# Patient Record
Sex: Male | Born: 1960 | Race: White | Hispanic: No | Marital: Married | Smoking: Former smoker
Health system: Southern US, Community
[De-identification: ages and names within clinical notes are randomized; demographics above are authoritative.]

## PROBLEM LIST (undated history)

## (undated) DIAGNOSIS — I10 Essential (primary) hypertension: Secondary | ICD-10-CM

---

## 2006-07-08 ENCOUNTER — Inpatient Hospital Stay: Payer: Self-pay | Admitting: Unknown Physician Specialty

## 2007-05-09 ENCOUNTER — Other Ambulatory Visit: Payer: Self-pay

## 2007-05-09 ENCOUNTER — Emergency Department: Payer: Self-pay | Admitting: Emergency Medicine

## 2007-09-03 ENCOUNTER — Emergency Department: Payer: Self-pay | Admitting: Emergency Medicine

## 2008-04-23 ENCOUNTER — Emergency Department: Payer: Self-pay | Admitting: Emergency Medicine

## 2008-04-24 ENCOUNTER — Other Ambulatory Visit: Payer: Self-pay

## 2010-06-05 ENCOUNTER — Emergency Department: Payer: Self-pay | Admitting: Emergency Medicine

## 2011-06-27 ENCOUNTER — Emergency Department: Payer: Self-pay | Admitting: Emergency Medicine

## 2011-06-27 IMAGING — CT CT HEAD WITHOUT CONTRAST
2 series · 16 of 30 positions shown, 20 images · non-contrast
Comparison: none

REASON FOR EXAM: tia symptoms
COMMENTS:

[Series 2: without · axial · non-contrast · 0.44mm/px · z∈[-156,-26]mm · 13 of 32 slices shown, 17 images]
[im 3/32  brain]
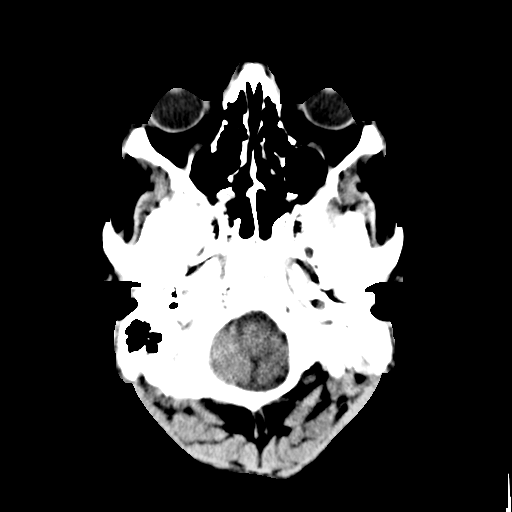
[im 3/32  bone]
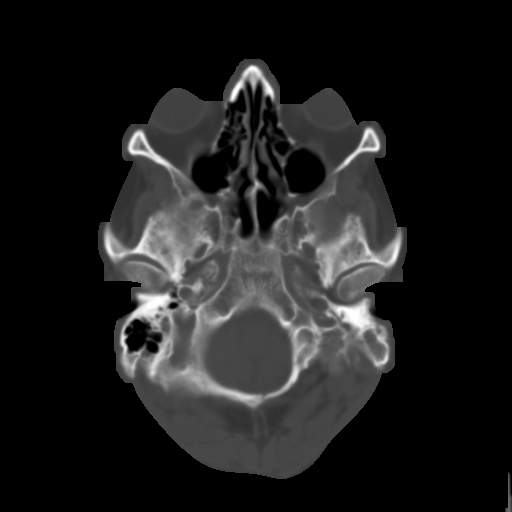
[im 5/32  brain]
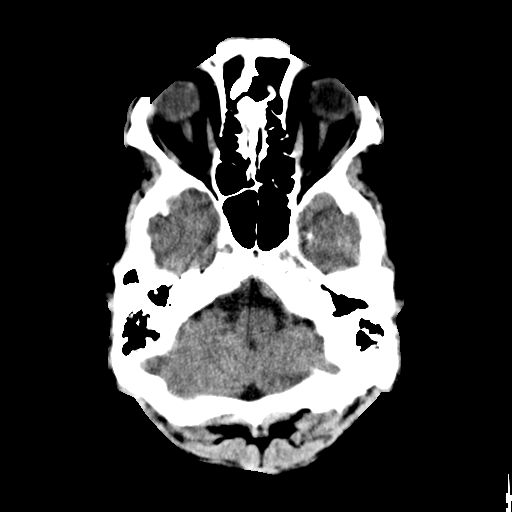
[im 7/32  brain]
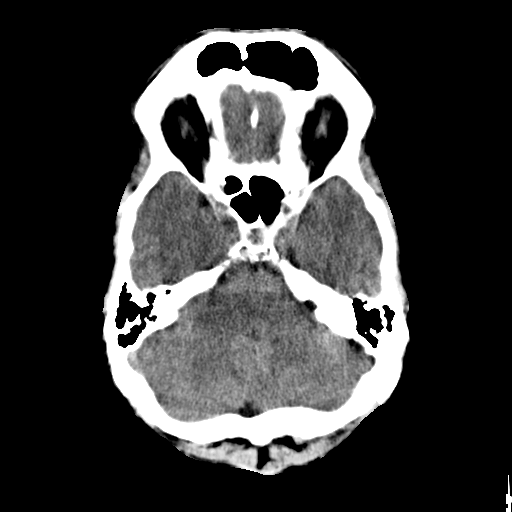
[im 9/32  brain]
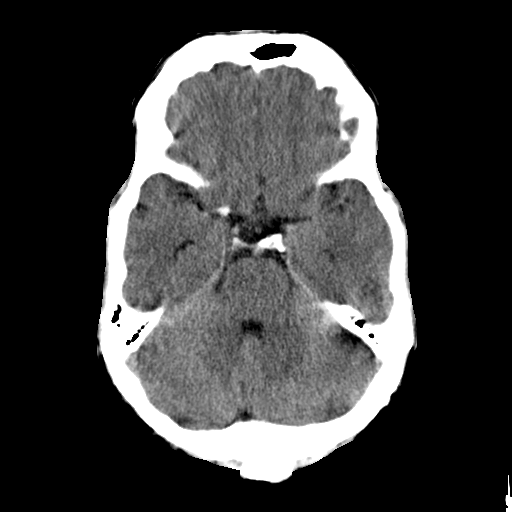
[im 12/32  brain]
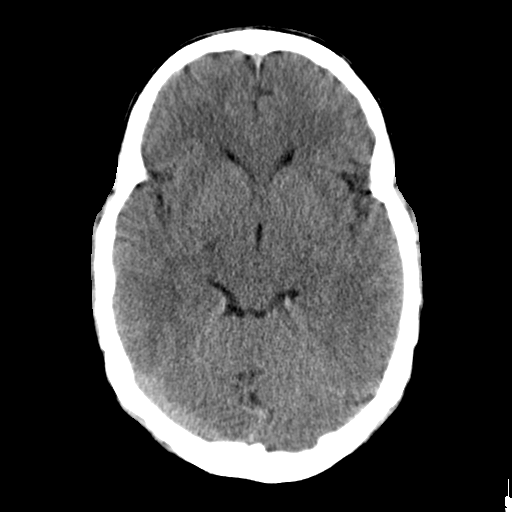
[im 12/32  bone]
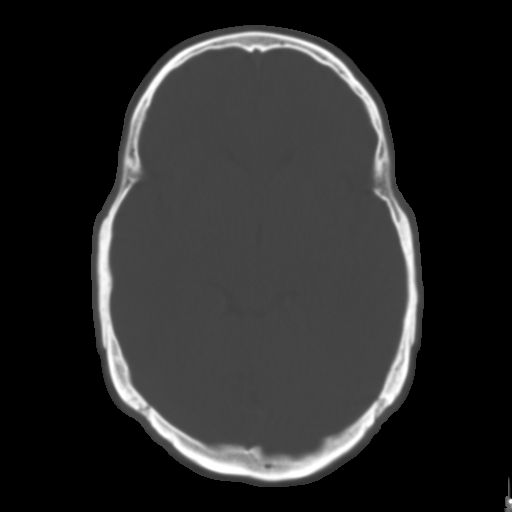
[im 14/32  brain]
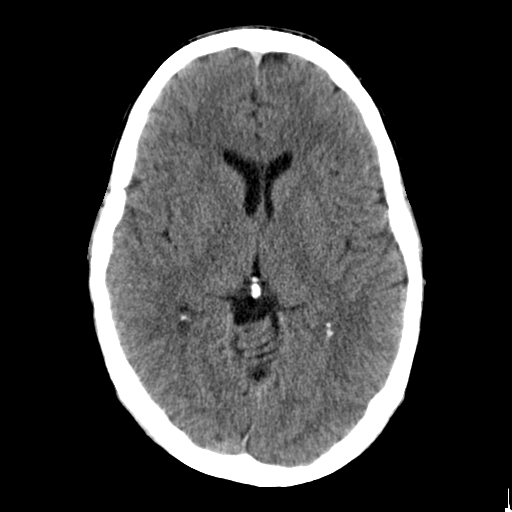
[im 16/32  brain]
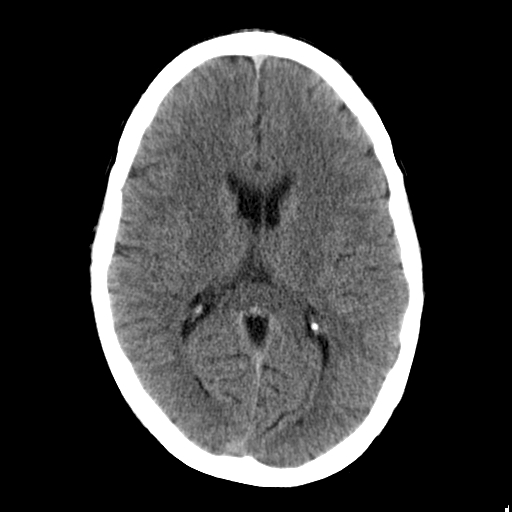
[im 18/32  brain]
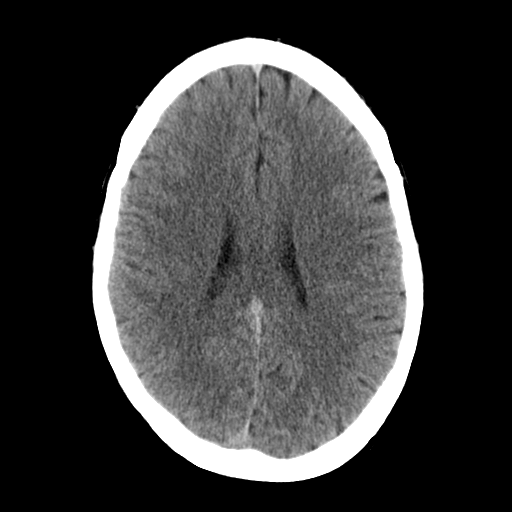
[im 20/32  brain]
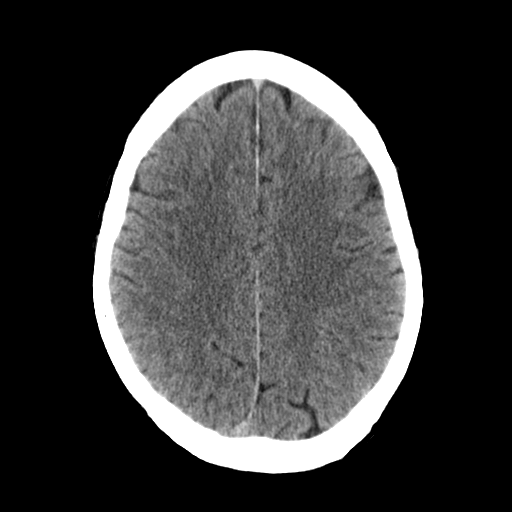
[im 20/32  bone]
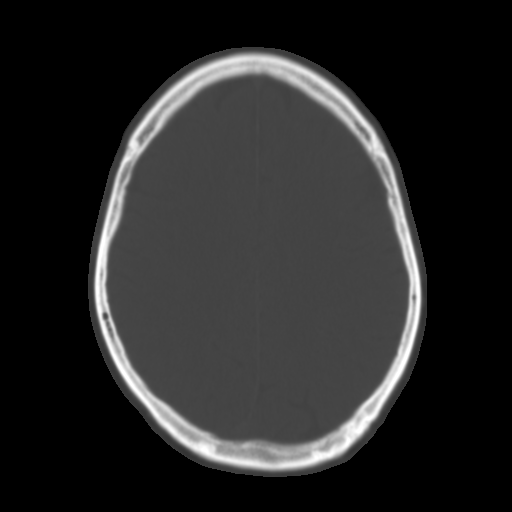
[im 23/32  brain]
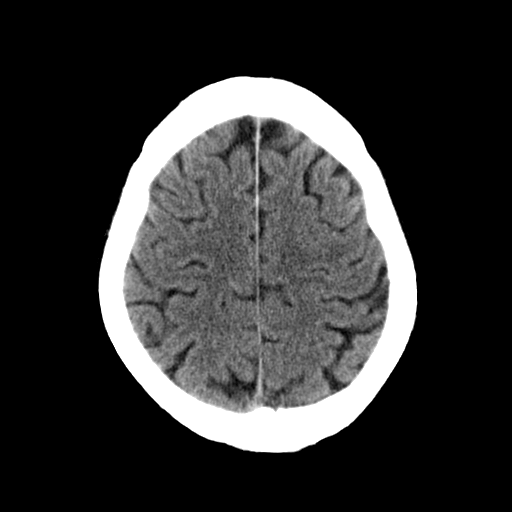
[im 25/32  brain]
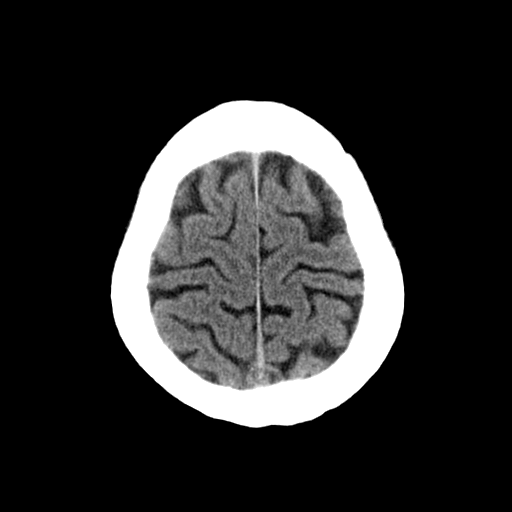
[im 27/32  brain]
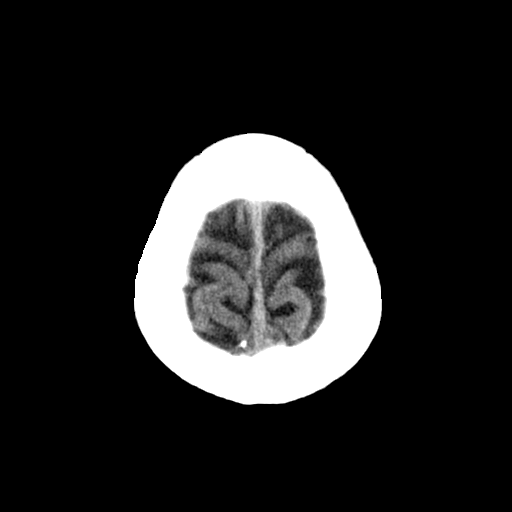
[im 29/32  brain]
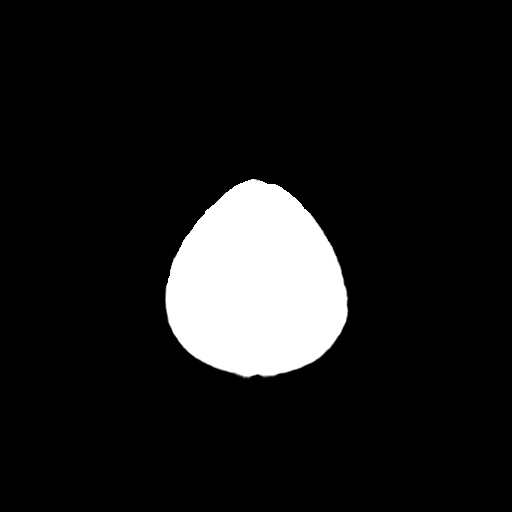
[im 29/32  bone]
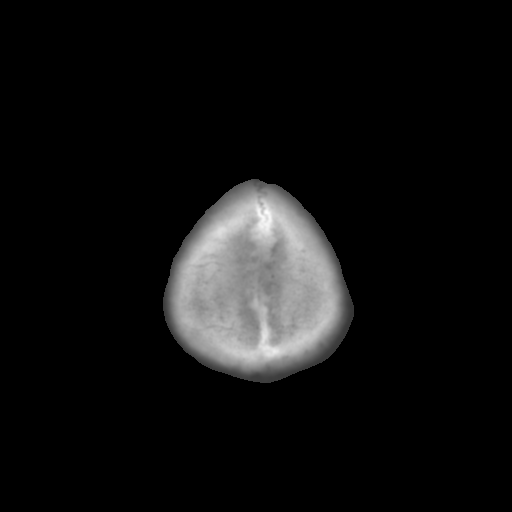

[Series 3: bone · axial · 0.44mm/px · z∈[-156,-111]mm · 3 of 32 slices shown]
[im 3/32  bone]
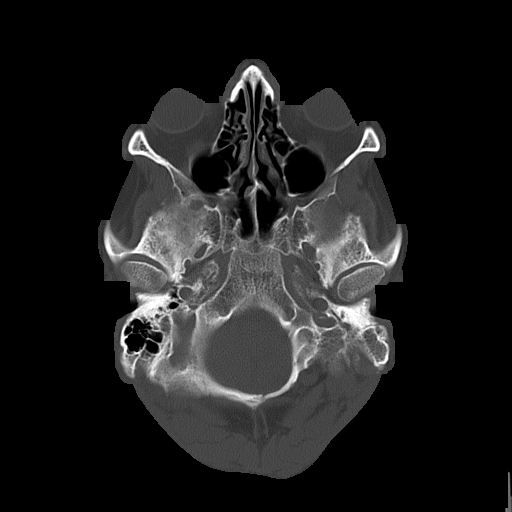
[im 7/32  bone]
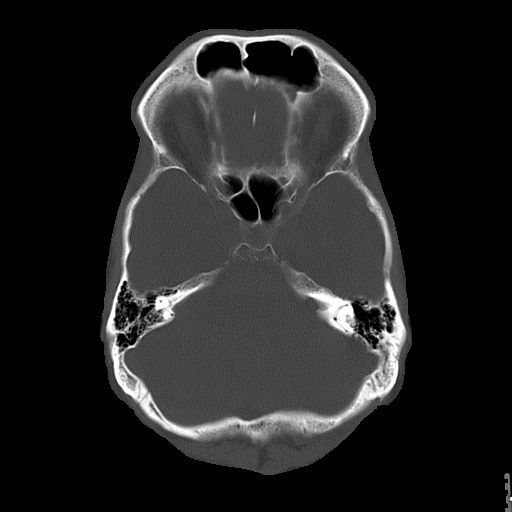
[im 12/32  bone]
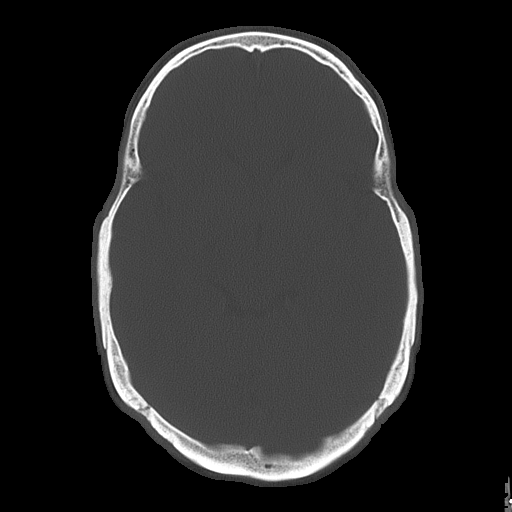

[16 of 30 positions shown; findings below may reference images not displayed]

PROCEDURE:     CT  - CT HEAD WITHOUT CONTRAST  - [DATE] [DATE]

RESULT:     Axial noncontrast CT scanning was performed through the brain at
5 mm intervals and slice thicknesses.

The ventricles are normal in size and position. There is no intracranial
hemorrhage nor intracranial mass effect. The cerebellum and brainstem are
normal in density. There is no evidence of an evolving ischemic infarction.
No abnormal intracranial calcifications are demonstrated.

The observed portions of the paranasal sinuses and mastoid air cells are
clear. The external auditory canals appear patent where visualized. The
structures of the middle ear cavities are grossly normal.
IMPRESSION: 1. I see no acute abnormality of the brain.
2. The visualized portions of the paranasal sinuses and mastoid air cells
are clear.

## 2011-11-14 ENCOUNTER — Emergency Department: Payer: Self-pay | Admitting: Emergency Medicine

## 2011-11-14 LAB — COMPREHENSIVE METABOLIC PANEL
Albumin: 4.5 g/dL (ref 3.4–5.0)
Alkaline Phosphatase: 79 U/L (ref 50–136)
BUN: 7 mg/dL (ref 7–18)
Chloride: 104 mmol/L (ref 98–107)
Creatinine: 0.89 mg/dL (ref 0.60–1.30)
EGFR (African American): >60
EGFR (Non-African Amer.): >60
Glucose: 89 mg/dL (ref 65–99)
Osmolality: 277 (ref 275–301)
Potassium: 4.4 mmol/L (ref 3.5–5.1)
SGOT(AST): 19 U/L (ref 15–37)
SGPT (ALT): 21 U/L
Sodium: 140 mmol/L (ref 136–145)
Total Protein: 8.1 g/dL (ref 6.4–8.2)

## 2011-11-14 LAB — CBC
MCHC: 34.3 g/dL (ref 32.0–36.0)
MCV: 97 fL (ref 80–100)
Platelet: 271 10*3/uL (ref 150–440)
RDW: 13.4 % (ref 11.5–14.5)

## 2013-10-19 ENCOUNTER — Ambulatory Visit: Payer: Self-pay | Admitting: Orthopedic Surgery

## 2013-10-19 ENCOUNTER — Emergency Department: Payer: Self-pay | Admitting: Emergency Medicine

## 2013-10-19 LAB — CBC
HCT: 38.9 % — ABNORMAL LOW (ref 40.0–52.0)
HGB: 13.3 g/dL (ref 13.0–18.0)
MCH: 32.6 pg (ref 26.0–34.0)
MCHC: 34.3 g/dL (ref 32.0–36.0)
MCV: 95 fL (ref 80–100)
Platelet: 266 10*3/uL (ref 150–440)
RBC: 4.09 10*6/uL — AB (ref 4.40–5.90)
RDW: 13.5 % (ref 11.5–14.5)
WBC: 6.5 10*3/uL (ref 3.8–10.6)

## 2013-10-19 IMAGING — CR DG HAND COMPLETE 3+V*L*
1 series · 3 of 3 positions shown · non-contrast
Comparison: None available for comparison at time of study
interpretation.

CLINICAL DATA: Bilateral hand lacerations.

EXAM:
LEFT HAND - COMPLETE 3+ VIEW; RIGHT HAND - COMPLETE 3+ VIEW

[Series 1: pa · 0.17mm/px · 3 of 3 slices shown]
[im 1/3]
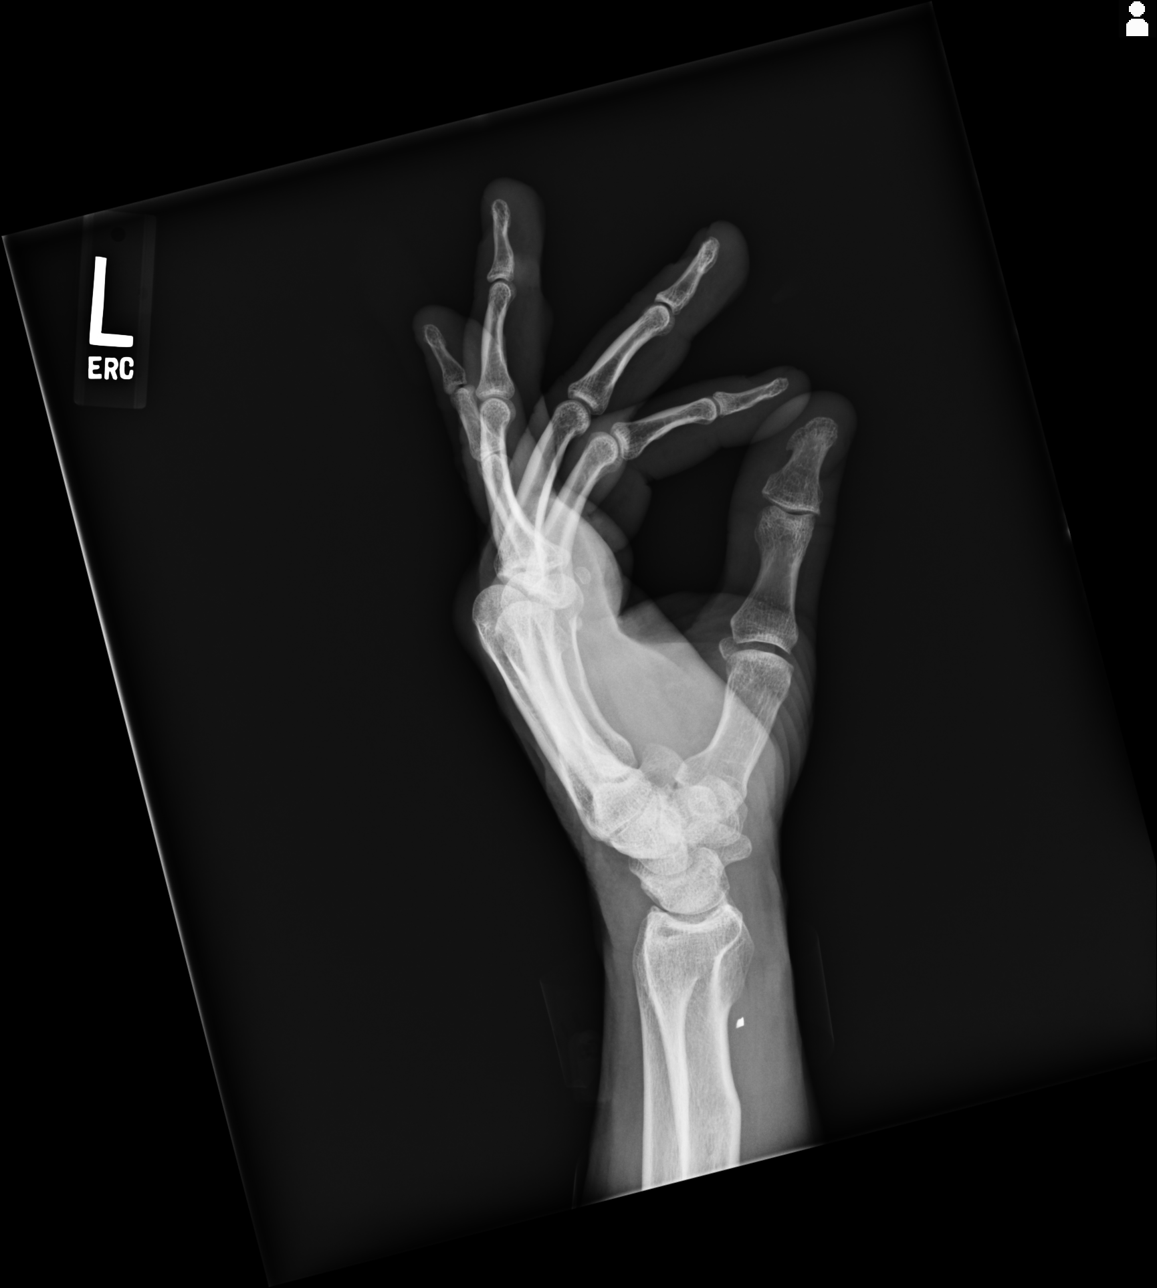
[im 2/3]
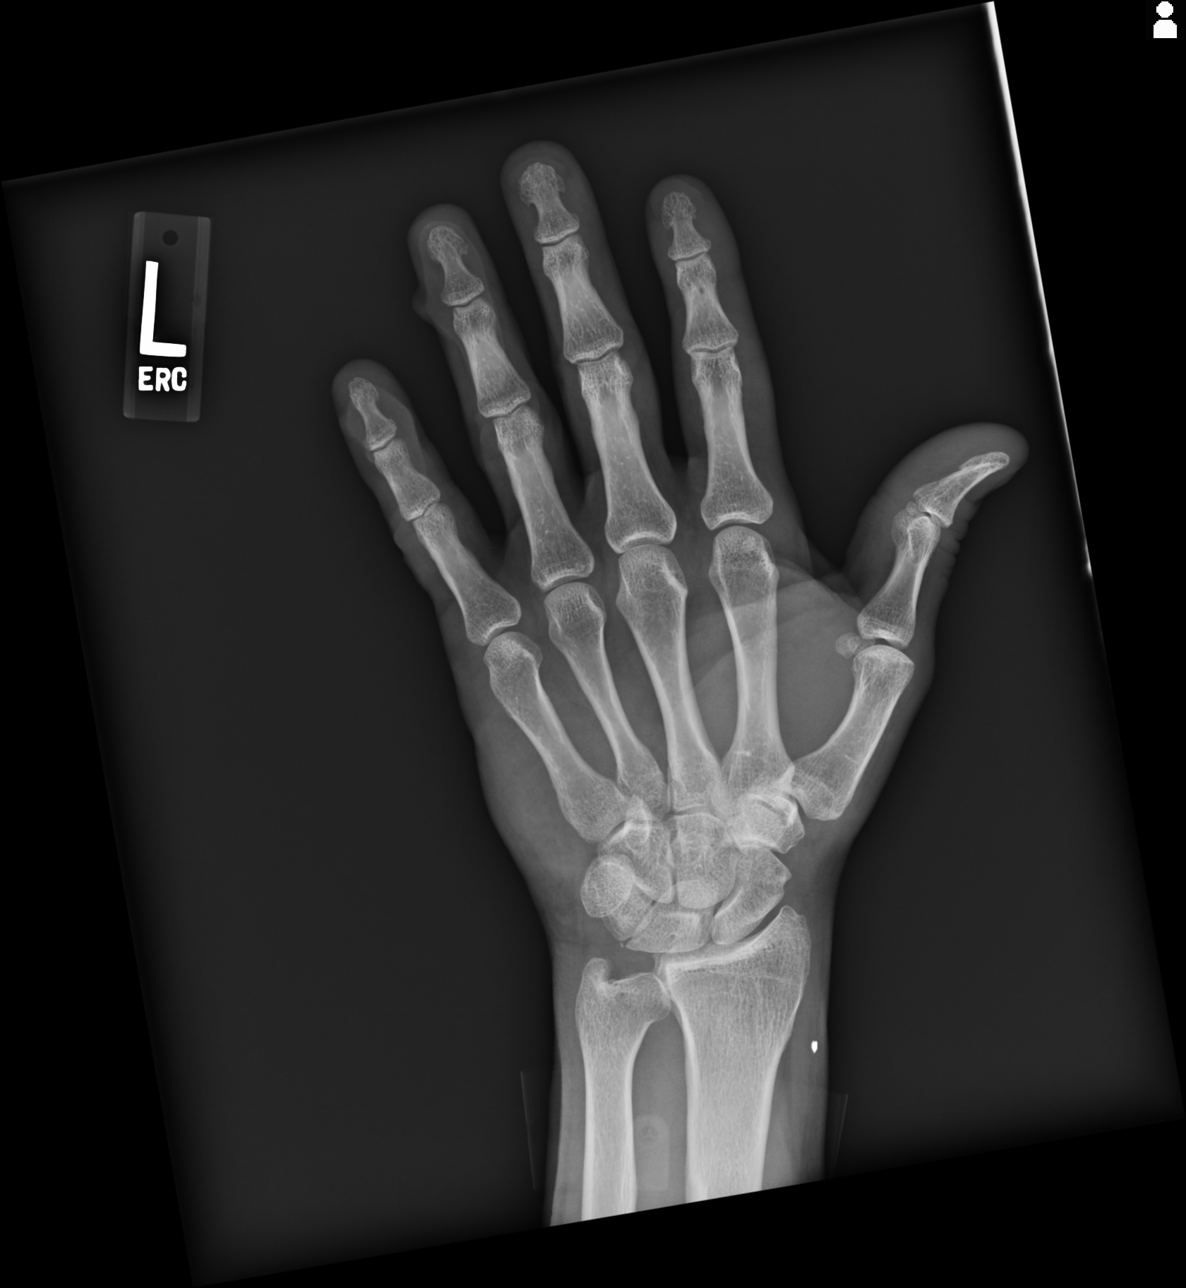
[im 3/3]
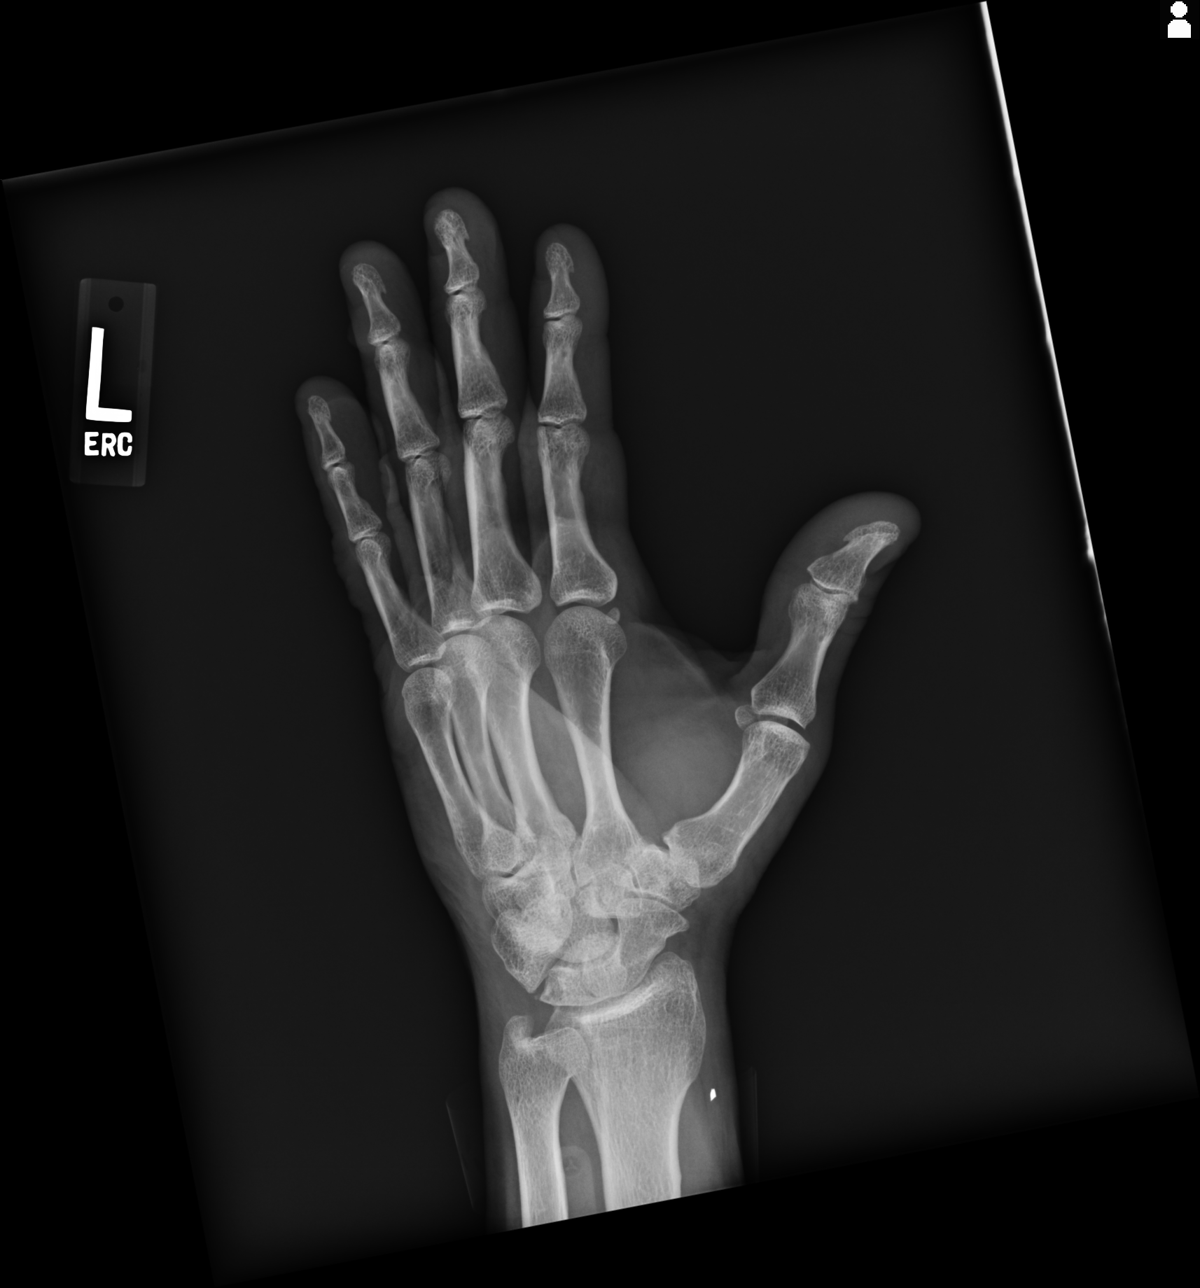

[3 of 3 positions shown; findings below may reference images not displayed]

FINDINGS: There is no evidence of fracture or dislocation. There is no
evidence of arthropathy or other focal bone abnormality. 3 mm
radiopaque foreign body projects in the radial soft tissues of the
distal wrist on the left. Soft tissue irregularity of the left
fourth digit may reflect laceration.
IMPRESSION: No acute fracture or frank dislocation.

3 mm radiopaque foreign body projecting in the left wrist radial
soft tissues.

  By: KRISTIJONA

## 2013-10-19 IMAGING — CR RIGHT HAND - COMPLETE 3+ VIEW
1 series · 3 of 3 positions shown · non-contrast
Comparison: None available for comparison at time of study
interpretation.

CLINICAL DATA: Bilateral hand lacerations.

EXAM:
LEFT HAND - COMPLETE 3+ VIEW; RIGHT HAND - COMPLETE 3+ VIEW

[Series 1: pa · 0.17mm/px · 3 of 3 slices shown]
[im 1/3]
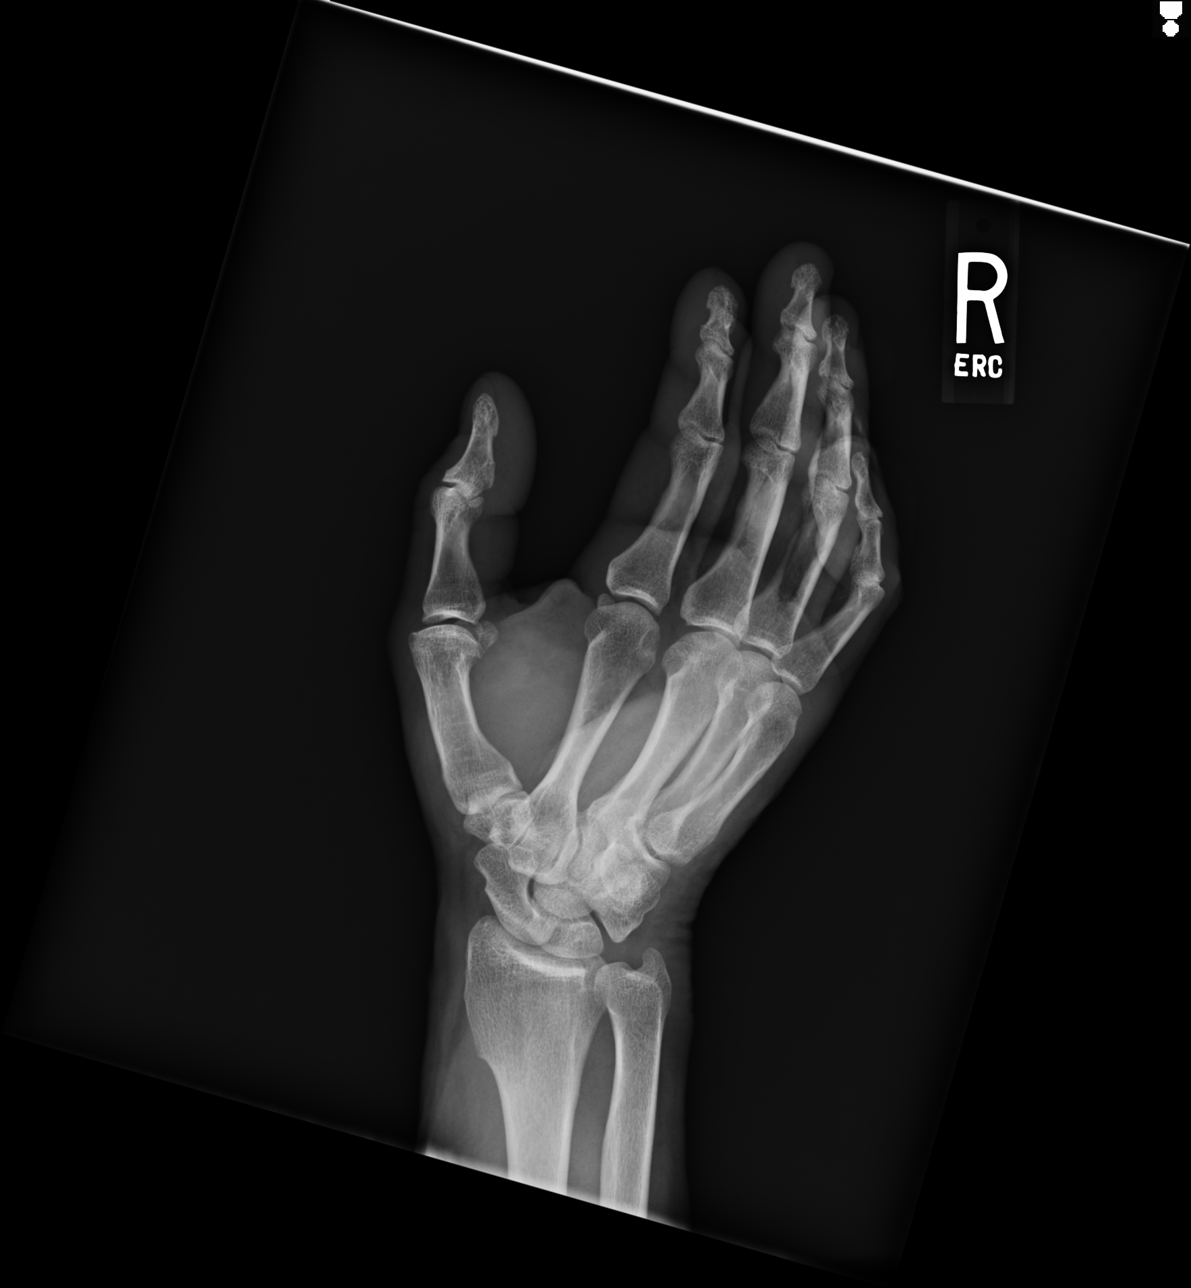
[im 2/3]
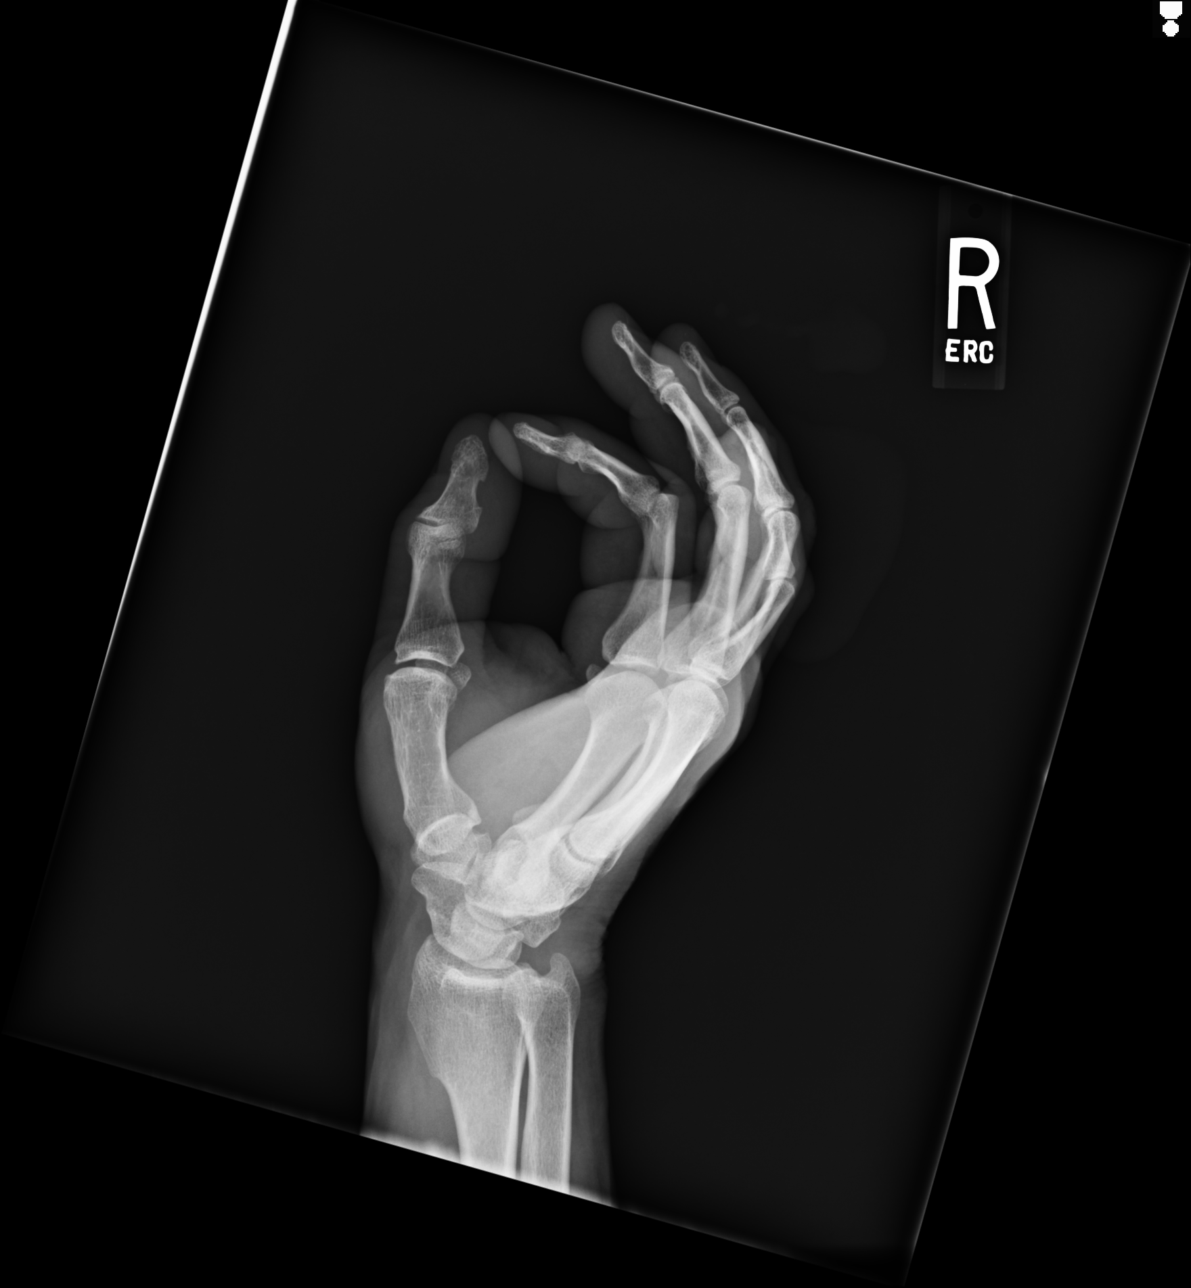
[im 3/3]
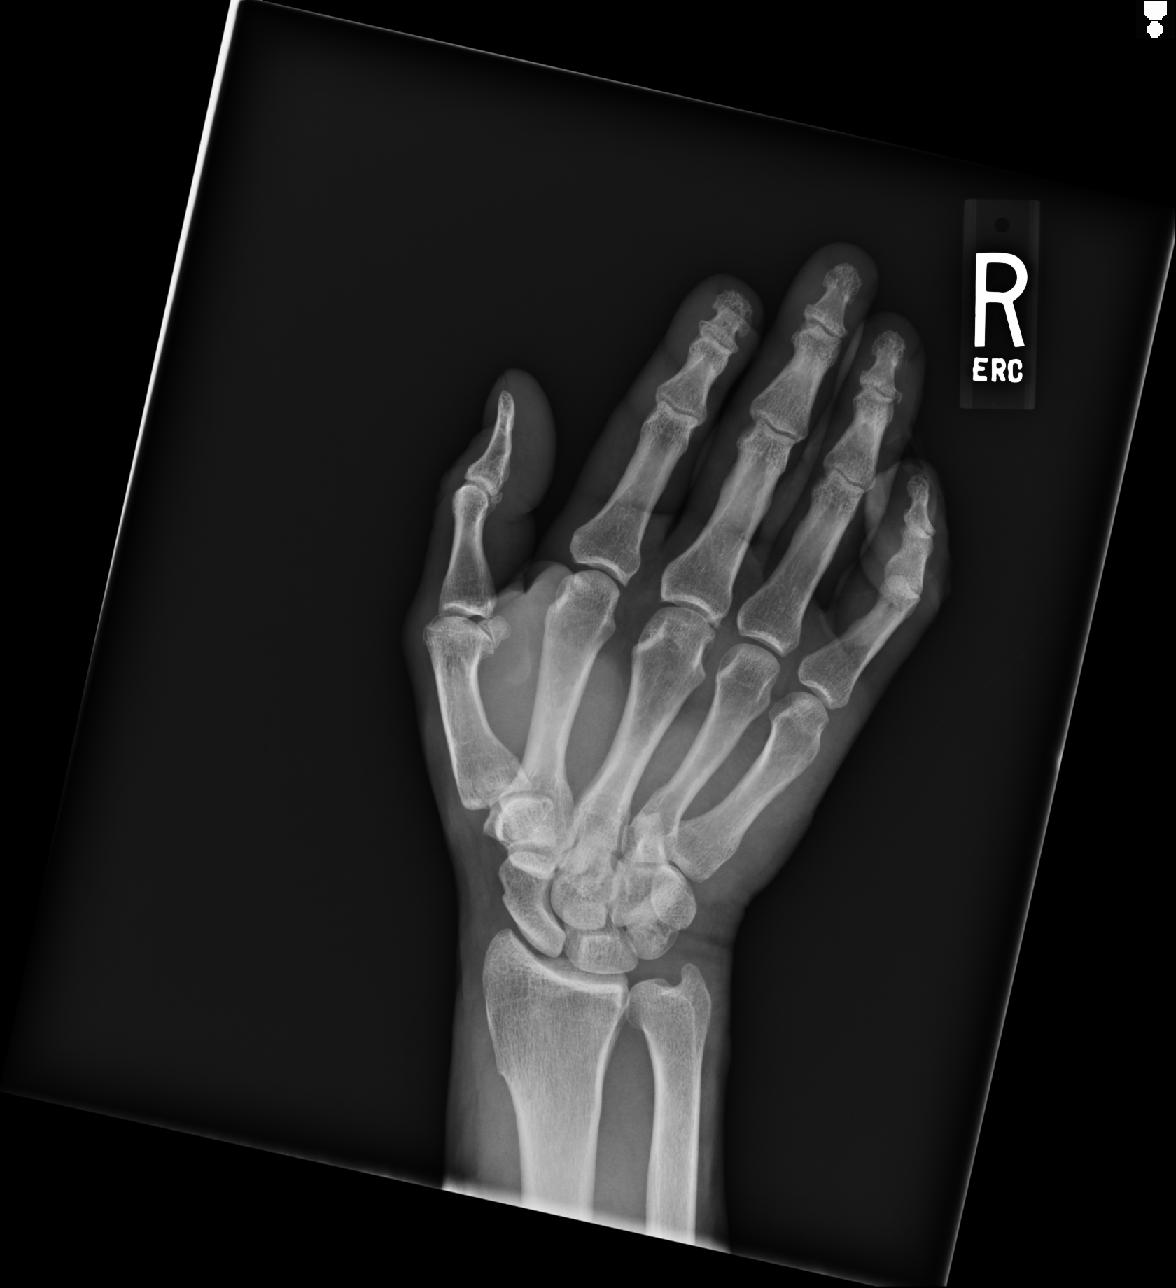

[3 of 3 positions shown; findings below may reference images not displayed]

FINDINGS: There is no evidence of fracture or dislocation. There is no
evidence of arthropathy or other focal bone abnormality. 3 mm
radiopaque foreign body projects in the radial soft tissues of the
distal wrist on the left. Soft tissue irregularity of the left
fourth digit may reflect laceration.
IMPRESSION: No acute fracture or frank dislocation.

3 mm radiopaque foreign body projecting in the left wrist radial
soft tissues.

  By: KRISTIJONA

## 2015-01-03 ENCOUNTER — Emergency Department: Admit: 2015-01-03 | Disposition: A | Discharge status: Home / Self Care | Payer: Self-pay | Admitting: Student

## 2015-01-15 NOTE — Consult Note (Signed)
PATIENT NAME:  Angel Padilla, [name] MR#:  [id number] DATE OF BIRTH:  09/14/1961  DATE OF CONSULTATION:  10/19/2013  REFERRING PHYSICIAN:  Emergency department  CONSULTING PHYSICIAN:  Ruthe MannanPeter Marjorie Deprey, MD  CHIEF COMPLAINT: Bilateral hand lacerations.  HISTORY: The patient is a 54 year old male who was at work and he got his hands caught in a slicing machine. He suffered injuries to his bilateral upper extremities. The patient had initially undergone partial treatment by the emergency department when they encountered an uncontrolled bleeding. I was called emergently to the ER to assist with management.   Upon my arrival, the patient had already been given bilateral upper extremity digital blocks in the small and ring finger. I was unable to obtain a sensory exam. There was profuse bleeding coming from the right small finger.   PAST MEDICAL HISTORY: None.  MEDICATIONS: See electronic record.  ALLERGIES: None.  SOCIAL HISTORY: The patient lives locally.  REVIEW OF SYSTEMS: Review of systems otherwise negative.   PHYSICAL EXAMINATION:  GENERAL: The patient is a well-nourished, well-developed male. He is moderate situational distress.   UPPER EXTREMITIES: The previous tourniquet was removed as it was a venous touniquet and not providing hemostasis. The right upper extremity was wrapped out with an Eschmark, and the touniquet was inflated. This provided a bloodless field. Wounds were irrigated and explored. I performed this for both the right and left upper extremities  Exploration of the fingers and my treatment was as follows:  RIGHT SMALL FINGER: Coronal plane laceration, 8 cm. Laceration of ulnar digital nerve and artery and 90% of FDP in Zone 1. Ligated the ulnar digital artery and closed the skin with 4-0 Nylon. Tip was warm and well perfused following this.  RIGHT RING FINGER: Longitudional laceration on ulnar side, 6 cm. Closed the skin with 4-0 Nylon. Tip was warm and well perfused following  this.  LEFT SMALL FINGER: Coronal laceration, 8 cm. In pulp only, superficial to the NV bundle. Closed the skin with 4-0 Nylon. Tip was warm and well perfused following this.  LEFT RING FINGER: Coronal laceration, 6 cm. In dermis only, with a thin flap. Defatted the flap, pie-crusted the full-thickness dermis flap with an #11 blade and inset the dermis like a full-thuckness skin graft. Closed the skin with 4-0 Nylon. Tip was warm and well perfused following this.  All wound were then sterilly dressed.  ASSESSMENT AND PLAN: 54 year old male with a work related multiple finger injury. Given the degree of injury to the FDP tendon and digital nerves on the right small finger, my recommendation is for surgical exploration and repair of the FDP tendon and repair of the digital nerve, if technically feasible. This ought to be done by a Merchant navy officerqualified Hand Surgeon. My recommendation is to followup in the next 1 to 3 days with a Hand Surgeon for a repeat clinical examination and possible surgical exploration with repair of the right small finger ulnar digital nerve and FDP, if the Hand Surgeon feels that this is appropriate.   This plan was explained to the paitent, his boss and the plan RN. They verbalized their understanding.   ____________________________ Ruthe MannanPeter Kenshin Splawn, MD pa:sg D: 10/19/2013 10:26:00 ET T: 10/19/2013 10:33:03 ET JOB#: [id number]  cc: Ruthe MannanPeter Shon Mansouri, MD, <Dictator> Althea GrimmerPeter J. Mickle PlumbApel, MD PhD Orthopaedic Surgery ELECTRONICALLY SIGNED 10/19/2013 14:29

## 2015-01-24 ENCOUNTER — Emergency Department: Payer: BLUE CROSS/BLUE SHIELD

## 2015-01-24 ENCOUNTER — Encounter: Payer: Self-pay | Admitting: Emergency Medicine

## 2015-01-24 ENCOUNTER — Emergency Department
Admission: EM | Admit: 2015-01-24 | Discharge: 2015-01-24 | Disposition: A | Discharge status: Home / Self Care | Payer: BLUE CROSS/BLUE SHIELD | Attending: Emergency Medicine | Admitting: Emergency Medicine

## 2015-01-24 DIAGNOSIS — H9203 Otalgia, bilateral: Secondary | ICD-10-CM | POA: Insufficient documentation

## 2015-01-24 DIAGNOSIS — Z79899 Other long term (current) drug therapy: Secondary | ICD-10-CM | POA: Diagnosis not present

## 2015-01-24 DIAGNOSIS — R51 Headache: Secondary | ICD-10-CM | POA: Diagnosis not present

## 2015-01-24 DIAGNOSIS — Z720 Tobacco use: Secondary | ICD-10-CM | POA: Diagnosis not present

## 2015-01-24 DIAGNOSIS — I10 Essential (primary) hypertension: Secondary | ICD-10-CM | POA: Insufficient documentation

## 2015-01-24 DIAGNOSIS — R519 Headache, unspecified: Secondary | ICD-10-CM

## 2015-01-24 HISTORY — DX: Essential (primary) hypertension: I10

## 2015-01-24 IMAGING — CT CT HEAD W/O CM
1 series · 16 of 30 positions shown, 20 images · non-contrast
Comparison: [DATE]

CLINICAL DATA: Complains of sinus pressure, dizziness, ear pain 2-3
months.

EXAM:
CT HEAD WITHOUT CONTRAST
TECHNIQUE: Contiguous axial images were obtained from the base of the skull
through the vertex without intravenous contrast.

[Series 2: head wo · axial · 0.44mm/px · z∈[+704,+848]mm · 16 of 36 slices shown, 20 images]
[im 2/36  brain]
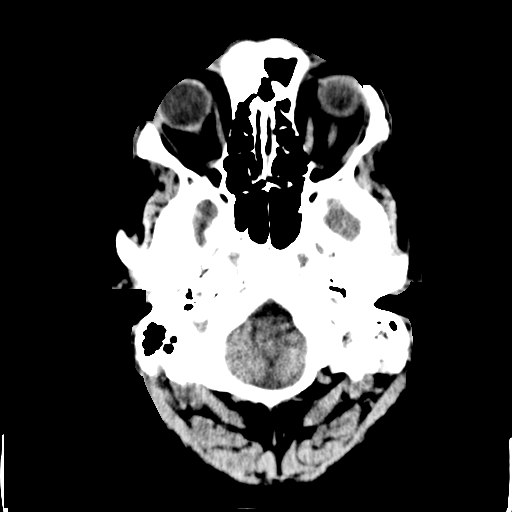
[im 2/36  bone]
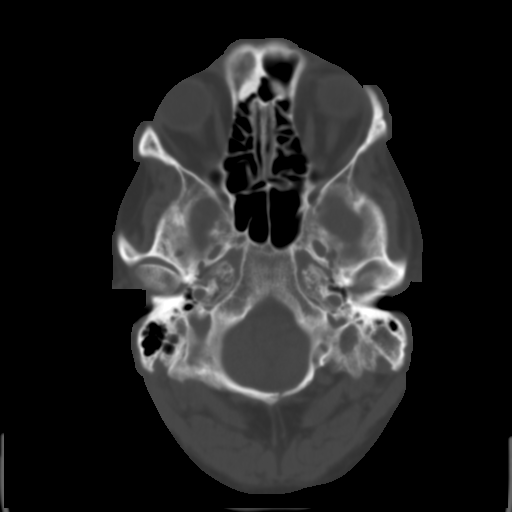
[im 4/36  brain]
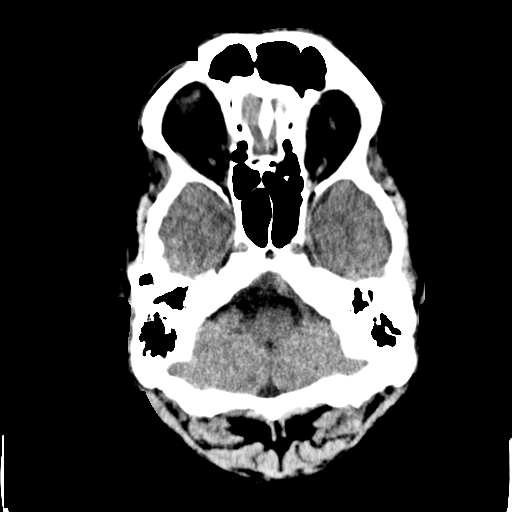
[im 7/36  brain]
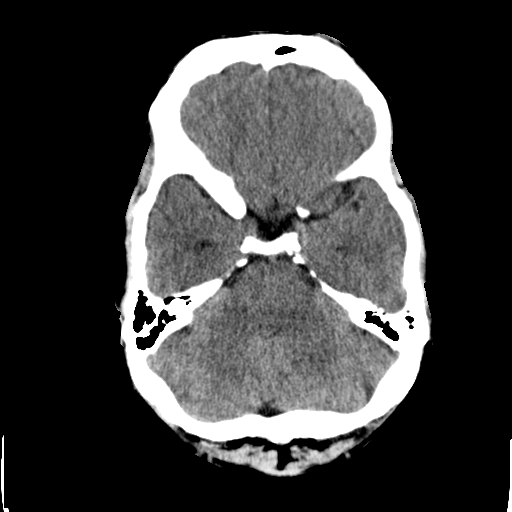
[im 9/36  brain]
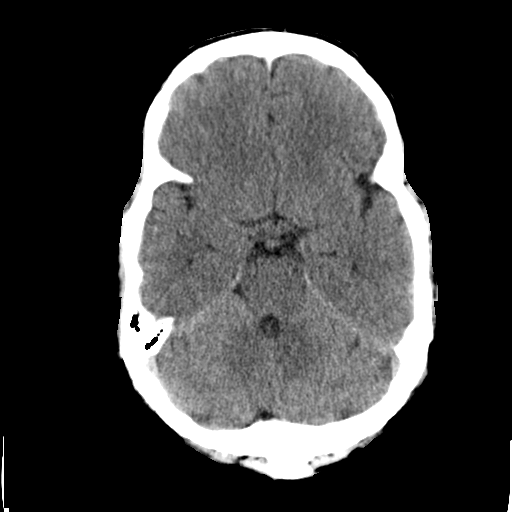
[im 10/36  brain]
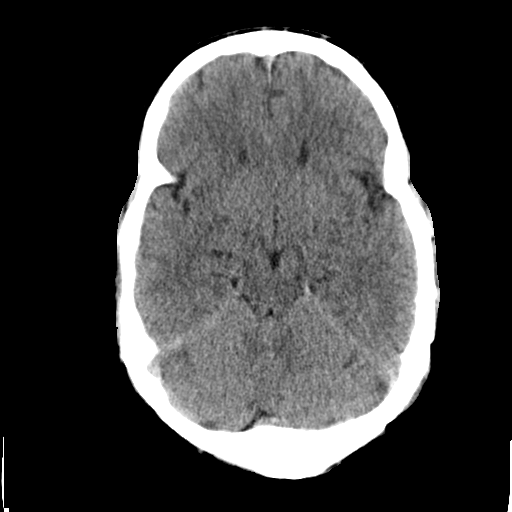
[im 10/36  bone]
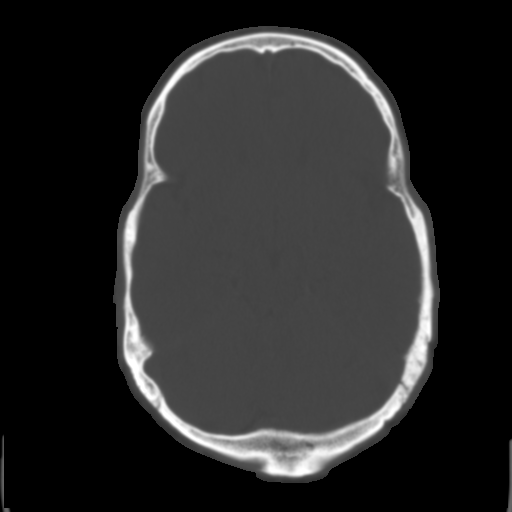
[im 13/36  brain]
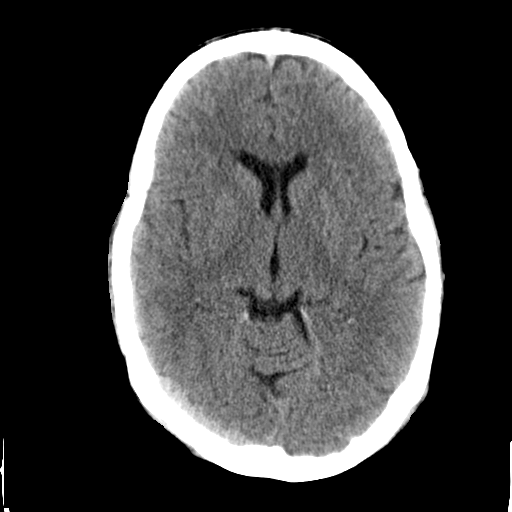
[im 15/36  brain]
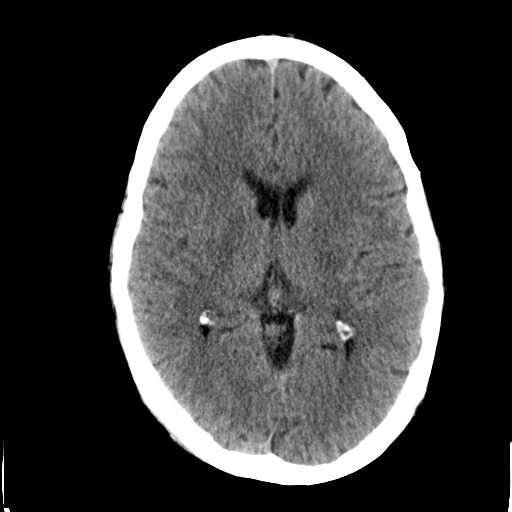
[im 17/36  brain]
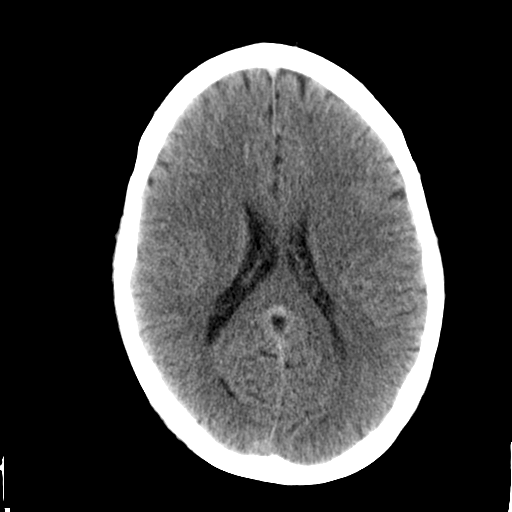
[im 19/36  brain]
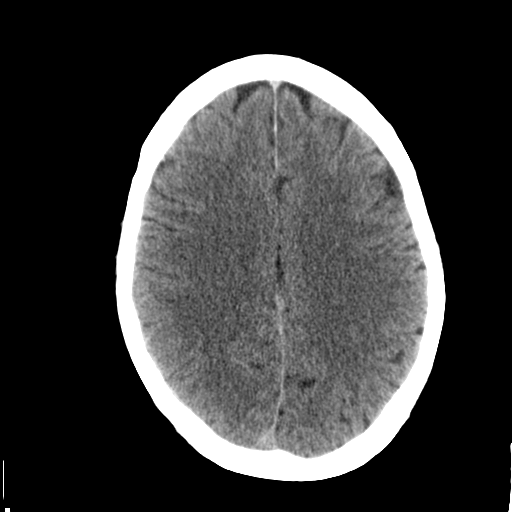
[im 19/36  bone]
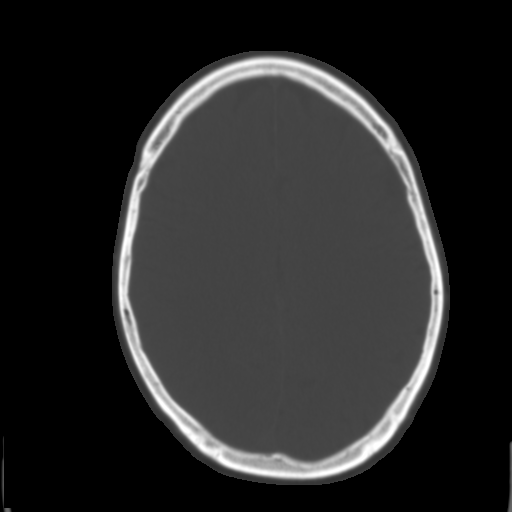
[im 21/36  brain]
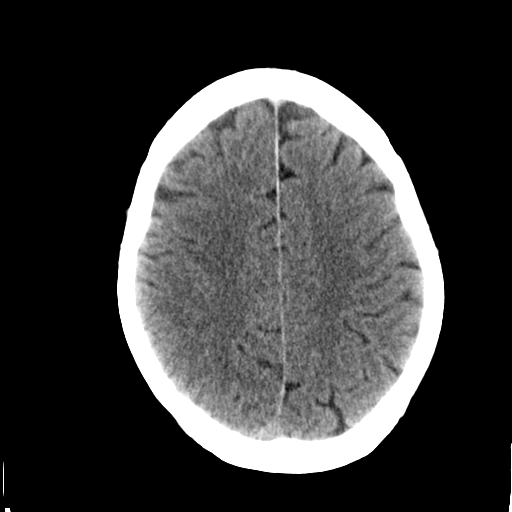
[im 23/36  brain]
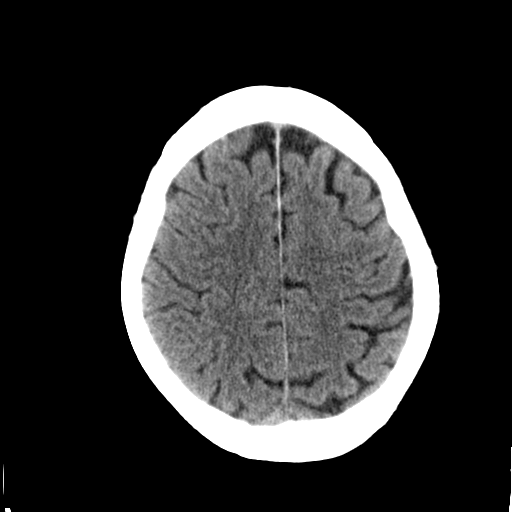
[im 26/36  brain]
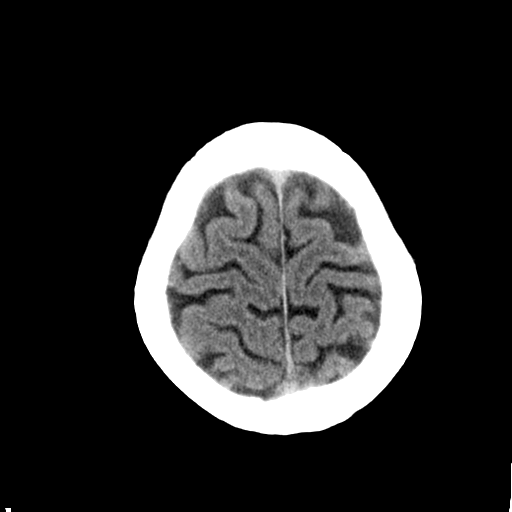
[im 27/36  brain]
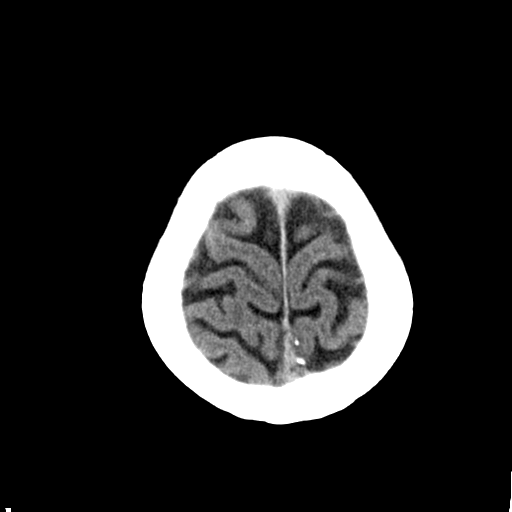
[im 27/36  bone]
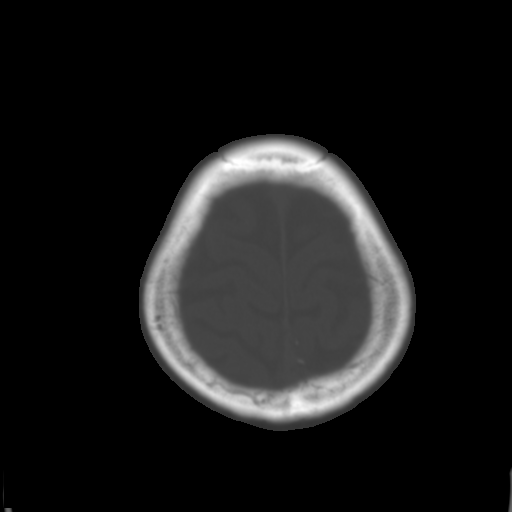
[im 29/36  brain]
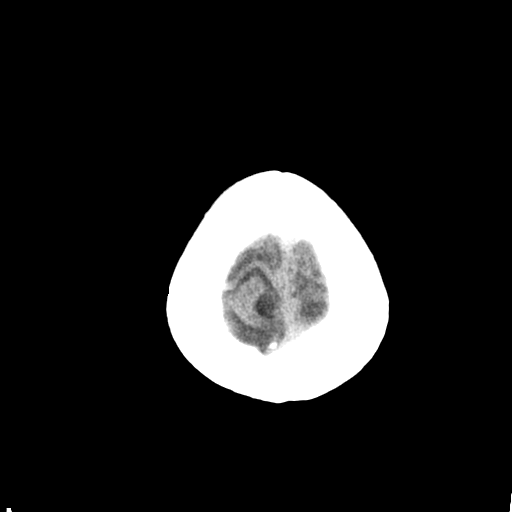
[im 32/36  brain]
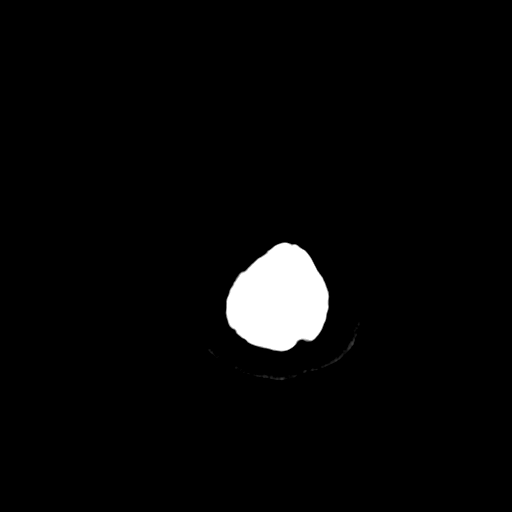
[im 34/36  brain]
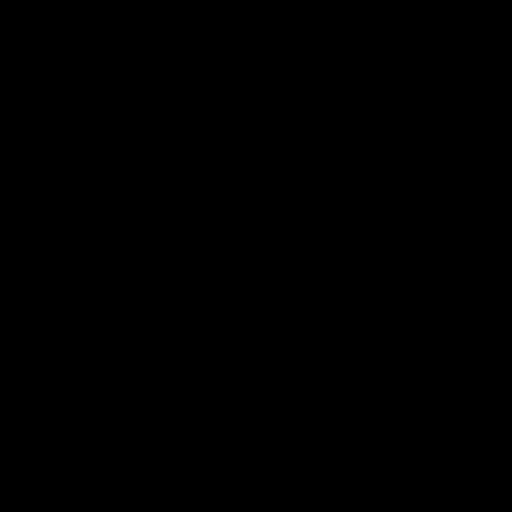

[16 of 30 positions shown; findings below may reference images not displayed]

FINDINGS: There is no evidence of mass effect, midline shift or extra-axial
fluid collections. There is no evidence of a space-occupying lesion
or intracranial hemorrhage. There is no evidence of a cortical-based
area of acute infarction.

The ventricles and sulci are appropriate for the patient's age. The
basal cisterns are patent.

Visualized portions of the orbits are unremarkable. The visualized
portions of the paranasal sinuses and mastoid air cells are
unremarkable.

The osseous structures are unremarkable.
IMPRESSION: Normal CT of the brain without intravenous contrast.

## 2015-01-24 MED ORDER — CLONIDINE HCL 0.1 MG PO TABS: 0.1000 mg | ORAL_TABLET | Freq: Two times a day (BID) | ORAL | Status: DC

## 2015-01-24 MED ORDER — CLONIDINE HCL 0.1 MG PO TABS
ORAL_TABLET | ORAL | Status: AC
  Filled 2015-01-24: qty 1

## 2015-01-24 MED ORDER — BUTALBITAL-APAP-CAFFEINE 50-325-40 MG PO TABS
1.0000 | ORAL_TABLET | Freq: Four times a day (QID) | ORAL | Status: AC | PRN
Start: 2015-01-24 — End: 2016-01-24

## 2015-01-24 MED ORDER — CLONIDINE HCL 0.1 MG PO TABS
0.1000 mg | ORAL_TABLET | Freq: Once | ORAL | Status: AC
  Administered 2015-01-24: 0.1 mg via ORAL

## 2015-01-24 NOTE — ED Provider Notes (Signed)
[facility] Emergency Department Provider Note    ____________________________________________  Time seen: 09 36  I have reviewed the triage vital signs and the nursing notes.   HISTORY  Chief Complaint Otalgia  HPI Angel Padilla is a 54 y.o. male who presents with bilateral ear pain and pressure in his head. He also states that he has been having trouble concentrating at work. He has been evaluated by [facility]. Per patient the ENT specialist told him he would need to have a scan if his symptoms persisted. He reports compliance with antibiotic prescriptions, allergy medication, and home medicines. Patient states none of the current medications are making the symptoms better he actually feels worse.  Past Medical History  Diagnosis Date  . Hypertension     There are no active problems to display for this patient.   History reviewed. No pertinent past surgical history.  Current Outpatient Rx  Name  Route  Sig  Dispense  Refill  . atenolol (TENORMIN) 50 MG tablet   Oral   Take 50 mg by mouth.         . gabapentin (NEURONTIN) 600 MG tablet   Oral   Take 600 mg by mouth 3 (three) times daily.         Marland Kitchen. ibuprofen (ADVIL,MOTRIN) 600 MG tablet   Oral   Take 600 mg by mouth every 6 (six) hours as needed.         Marland Kitchen. lisinopril (PRINIVIL,ZESTRIL) 20 MG tablet   Oral   Take 20 mg by mouth daily.         . butalbital-acetaminophen-caffeine (FIORICET) 50-325-40 MG per tablet   Oral   Take 1-2 tablets by mouth every 6 (six) hours as needed for headache.   10 tablet   0   . cloNIDine (CATAPRES) 0.1 MG tablet   Oral   Take 1 tablet (0.1 mg total) by mouth 2 (two) times daily.   30 tablet   0     Allergies Review of patient's allergies indicates no known allergies.  History reviewed. No pertinent family history.  Social History History  Substance Use Topics  . Smoking status: Current Every Day Smoker  . Smokeless tobacco: Not on  file  . Alcohol Use: No    Review of Systems  Constitutional: Negative for fever. Eyes: Negative for visual changes. ENT: Negative for sore throat. Positive for otalgia bilaterally.  Cardiovascular: Negative for chest pain. Respiratory: Negative for shortness of breath. Gastrointestinal: Negative for abdominal pain, vomiting and diarrhea. Musculoskeletal: Negative for back pain. Skin: Negative for rash. Neurological: Negative for headaches, focal weakness or numbness. Reports feeling "pressure" generalized in head. 10-point ROS otherwise negative.  ____________________________________________   PHYSICAL EXAM:  VITAL SIGNS: ED Triage Vitals  Enc Vitals Group     BP 01/24/15 0900 180/104 mmHg     Pulse Rate 01/24/15 0900 60     Resp 01/24/15 0900 14     Temp 01/24/15 0900 98.1 F (36.7 C)     Temp Source 01/24/15 0900 Oral     SpO2 01/24/15 0900 100 %     Weight 01/24/15 0911 185 lb (83.915 kg)     Height 01/24/15 0900 5\' 9"  (1.753 m)     Head Cir --      Peak Flow --      Pain Score 01/24/15 0911 7     Pain Loc --      Pain Edu? --      Excl.  in GC? --      Constitutional: Alert and oriented. Well appearing and in no distress. Eyes: Conjunctivae are normal. PERRL. Normal extraocular movements. ENT      Ears: TMs appear normal.   Head: Normocephalic and atraumatic.   Nose: No congestion/rhinnorhea.   Mouth/Throat: Mucous membranes are moist.   Neck: No stridor. Hematological/Lymphatic/Immunilogical: No cervical lymphadenopathy. Cardiovascular: Normal rate, regular rhythm. Normal and symmetric distal pulses are present in all extremities. Respiratory: Normal respiratory effort without tachypnea nor retractions. Breath sounds are clear and equal bilaterally. No wheezes/rales/rhonchi. Musculoskeletal: Nontender with normal range of motion in all extremities. No joint effusions.  No lower extremity tenderness nor edema. Neurologic:  Normal speech and  language. No gross focal neurologic deficits are appreciated. Speech is normal. No gait instability. Skin:  Skin is warm, dry and intact. No rash noted. Psychiatric: Mood and affect are normal. Speech and behavior are normal. Patient exhibits appropriate insight and judgment.  ____________________________________________   EKG    ____________________________________________    RADIOLOGY  Normal CT of the brain.  ____________________________________________   PROCEDURES  Procedure(s) performed: None  Critical Care performed: No  ____________________________________________   INITIAL IMPRESSION / ASSESSMENT AND PLAN / ED COURSE  Pertinent labs & imaging results that were available during my care of the patient were reviewed by me and considered in my medical decision making (see chart for details).  CT results reviewed with the patient. Patient's blood pressure noted to be high today. He reports that his blood pressure is always high and he is taking 2 different blood pressure medications. Will give clonidine 0.1 and reasses to evaluate whether a reduction in blood pressure reduces the pressure he feels in his head and pain in his ears. ----------------------------------------- 12:58 PM on 01/24/2015 ----------------------------------------- Patient's blood pressure reduced to 147/91. He reports only minimal improvement in the otalgia and pressure in his head. Will have patient follow-up with primary care provider for further testing and/or treatment. We will provide clonidine 0.1  2 times a day until follow-up with primary care.  ____________________________________________   FINAL CLINICAL IMPRESSION(S) / ED DIAGNOSES  Final diagnoses:  Otalgia, bilateral  Pressure in head    Chinita Pester, FNP 01/24/15 1300  Chinita Pester, FNP 01/24/15 1301  Sharman Cheek, MD 01/25/15 1315

## 2015-01-24 NOTE — ED Notes (Signed)
Reviewed discharge instructions with patient who verbalized understanding.

## 2015-01-24 NOTE — ED Notes (Cosign Needed)
Presents with several mo hx of bilateral ear pain  Has been seen by [facility]  And had both cleaned out..states pain better

## 2015-01-24 NOTE — ED Notes (Signed)
Says ear pain for 2-3- months.  wetnt to ent and other docs.  Had ears cleaned out.  Still pain

## 2015-01-24 NOTE — ED Notes (Signed)
Pt reports that he was here for an earache, he went to the ENT and they told him that if he wasn't feeling any better he should come here for an xray.

## 2017-03-19 ENCOUNTER — Ambulatory Visit: Payer: Self-pay | Admitting: Family Medicine

## 2017-03-26 ENCOUNTER — Ambulatory Visit: Payer: Self-pay | Admitting: Family Medicine

## 2017-06-28 DIAGNOSIS — M26609 Unspecified temporomandibular joint disorder, unspecified side: Secondary | ICD-10-CM | POA: Insufficient documentation

## 2017-06-28 DIAGNOSIS — I10 Essential (primary) hypertension: Secondary | ICD-10-CM | POA: Insufficient documentation

## 2017-06-28 DIAGNOSIS — N529 Male erectile dysfunction, unspecified: Secondary | ICD-10-CM | POA: Insufficient documentation

## 2017-06-28 DIAGNOSIS — F17200 Nicotine dependence, unspecified, uncomplicated: Secondary | ICD-10-CM | POA: Insufficient documentation

## 2017-06-28 DIAGNOSIS — Z87898 Personal history of other specified conditions: Secondary | ICD-10-CM | POA: Insufficient documentation

## 2017-06-28 DIAGNOSIS — M542 Cervicalgia: Secondary | ICD-10-CM | POA: Insufficient documentation

## 2017-06-28 DIAGNOSIS — M549 Dorsalgia, unspecified: Secondary | ICD-10-CM | POA: Insufficient documentation

## 2017-07-21 ENCOUNTER — Emergency Department: Payer: Managed Care, Other (non HMO)

## 2017-07-21 ENCOUNTER — Emergency Department
Admission: EM | Admit: 2017-07-21 | Discharge: 2017-07-21 | Disposition: A | Discharge status: Home / Self Care | Payer: Managed Care, Other (non HMO) | Attending: Emergency Medicine | Admitting: Emergency Medicine

## 2017-07-21 DIAGNOSIS — Y939 Activity, unspecified: Secondary | ICD-10-CM | POA: Diagnosis not present

## 2017-07-21 DIAGNOSIS — Z79899 Other long term (current) drug therapy: Secondary | ICD-10-CM | POA: Insufficient documentation

## 2017-07-21 DIAGNOSIS — W458XXA Other foreign body or object entering through skin, initial encounter: Secondary | ICD-10-CM | POA: Diagnosis not present

## 2017-07-21 DIAGNOSIS — I10 Essential (primary) hypertension: Secondary | ICD-10-CM | POA: Insufficient documentation

## 2017-07-21 DIAGNOSIS — S61239A Puncture wound without foreign body of unspecified finger without damage to nail, initial encounter: Secondary | ICD-10-CM | POA: Insufficient documentation

## 2017-07-21 DIAGNOSIS — R52 Pain, unspecified: Secondary | ICD-10-CM

## 2017-07-21 DIAGNOSIS — Y999 Unspecified external cause status: Secondary | ICD-10-CM | POA: Diagnosis not present

## 2017-07-21 DIAGNOSIS — S6991XA Unspecified injury of right wrist, hand and finger(s), initial encounter: Secondary | ICD-10-CM | POA: Diagnosis present

## 2017-07-21 DIAGNOSIS — Y929 Unspecified place or not applicable: Secondary | ICD-10-CM | POA: Insufficient documentation

## 2017-07-21 DIAGNOSIS — F17200 Nicotine dependence, unspecified, uncomplicated: Secondary | ICD-10-CM | POA: Insufficient documentation

## 2017-07-21 IMAGING — CR DG HAND COMPLETE 3+V*L*
3 series · 3 of 3 positions shown · non-contrast
Comparison: Left hand radiograph [DATE]

CLINICAL DATA: Multiple lacerations

EXAM:
LEFT HAND - COMPLETE 3+ VIEW

[hand ap]
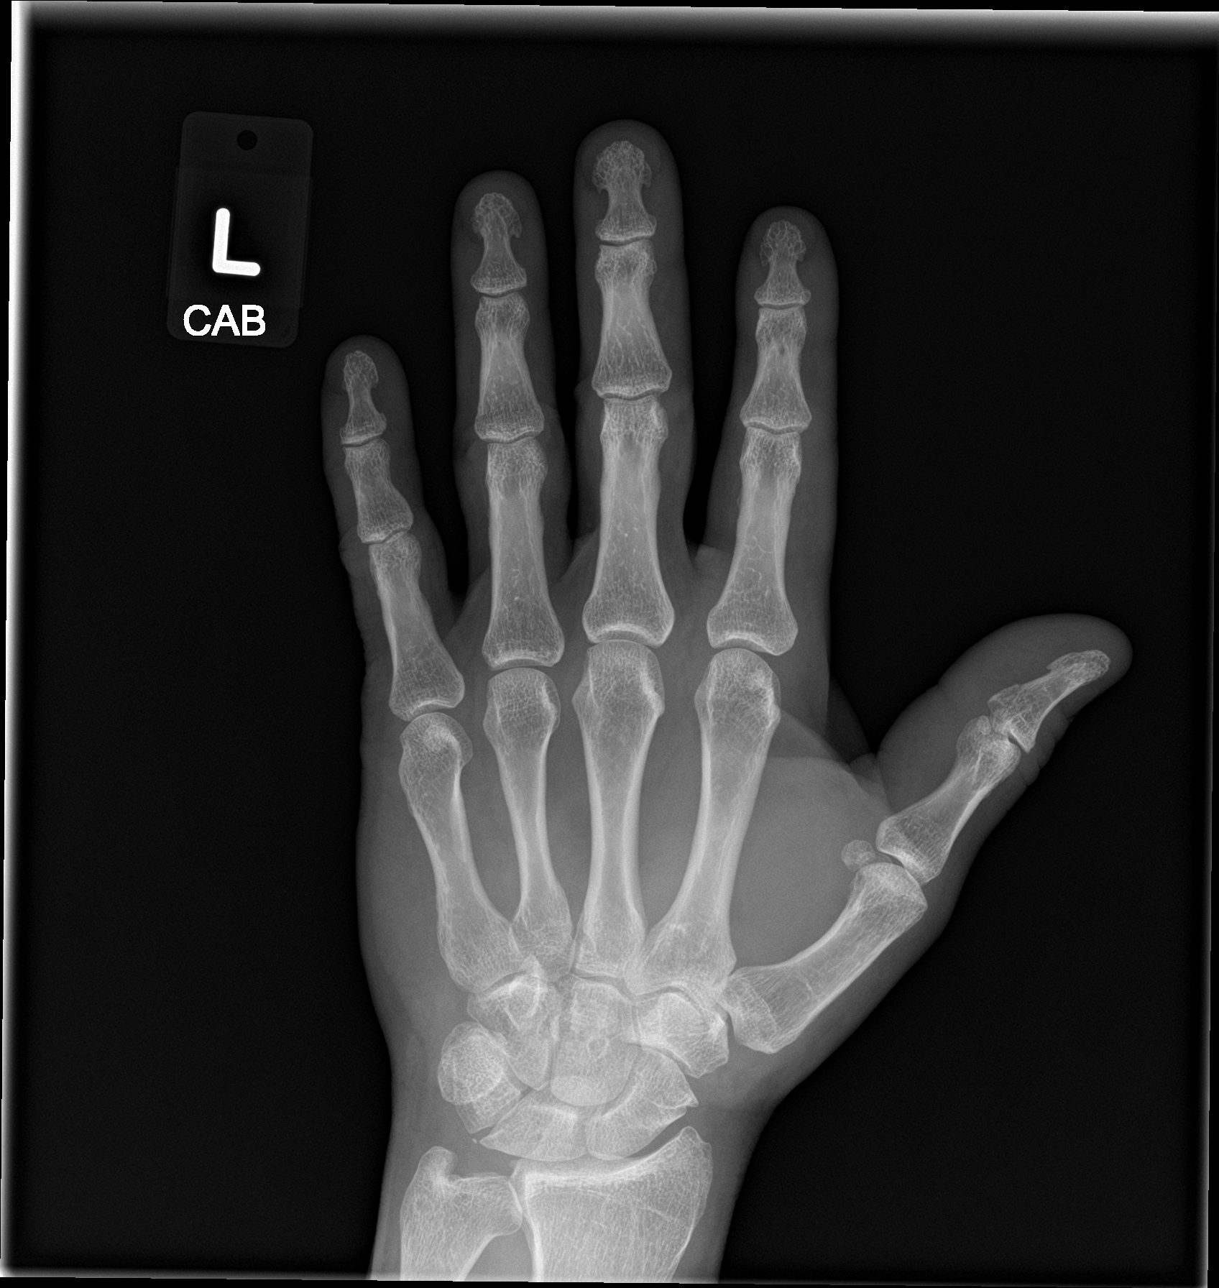

[hand obl]
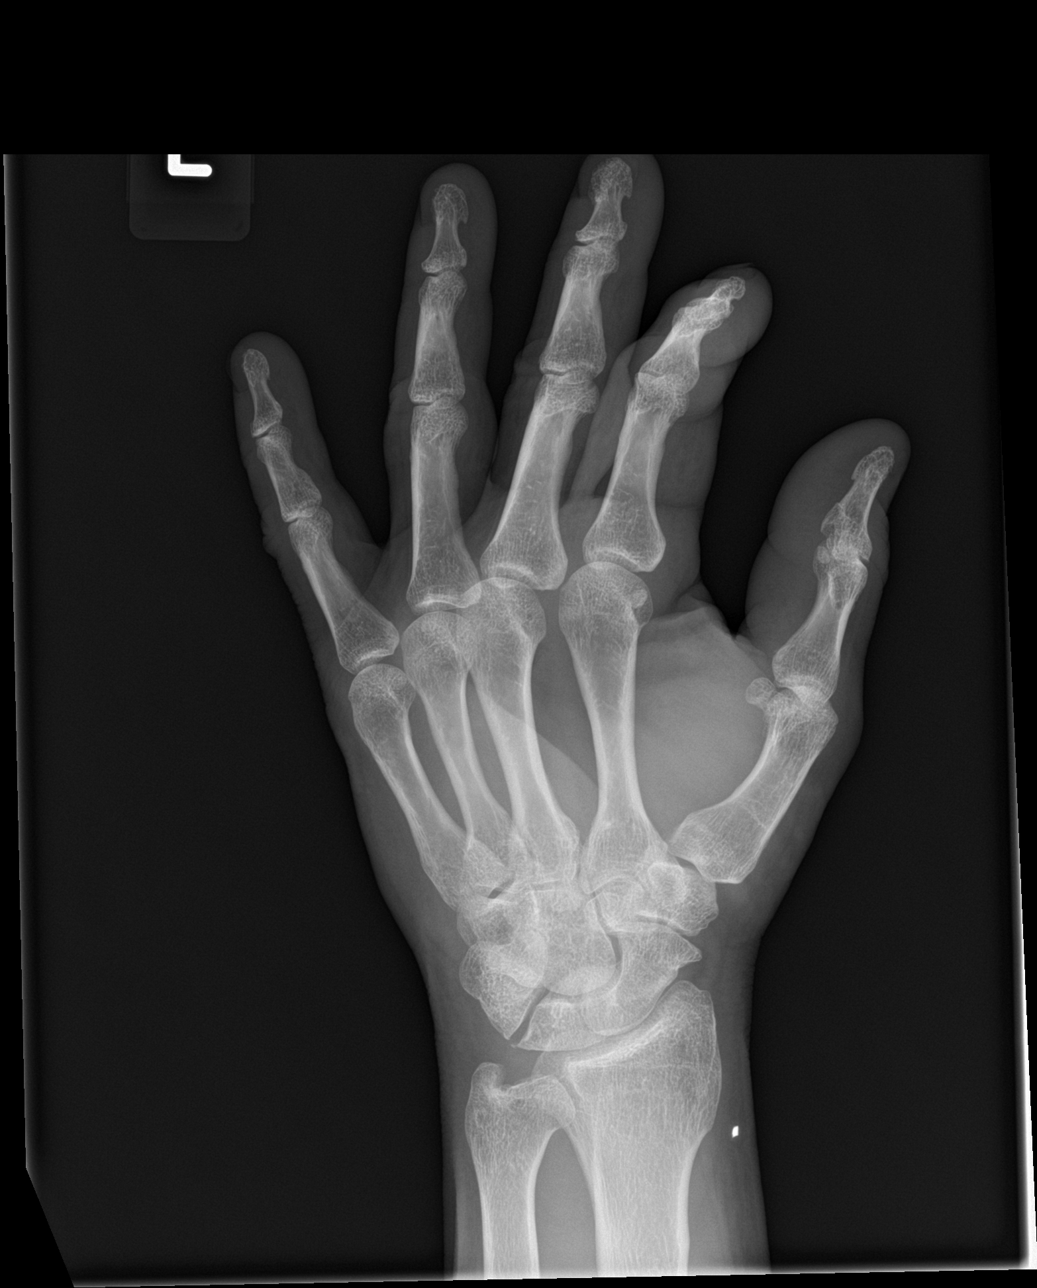

[hand lat]
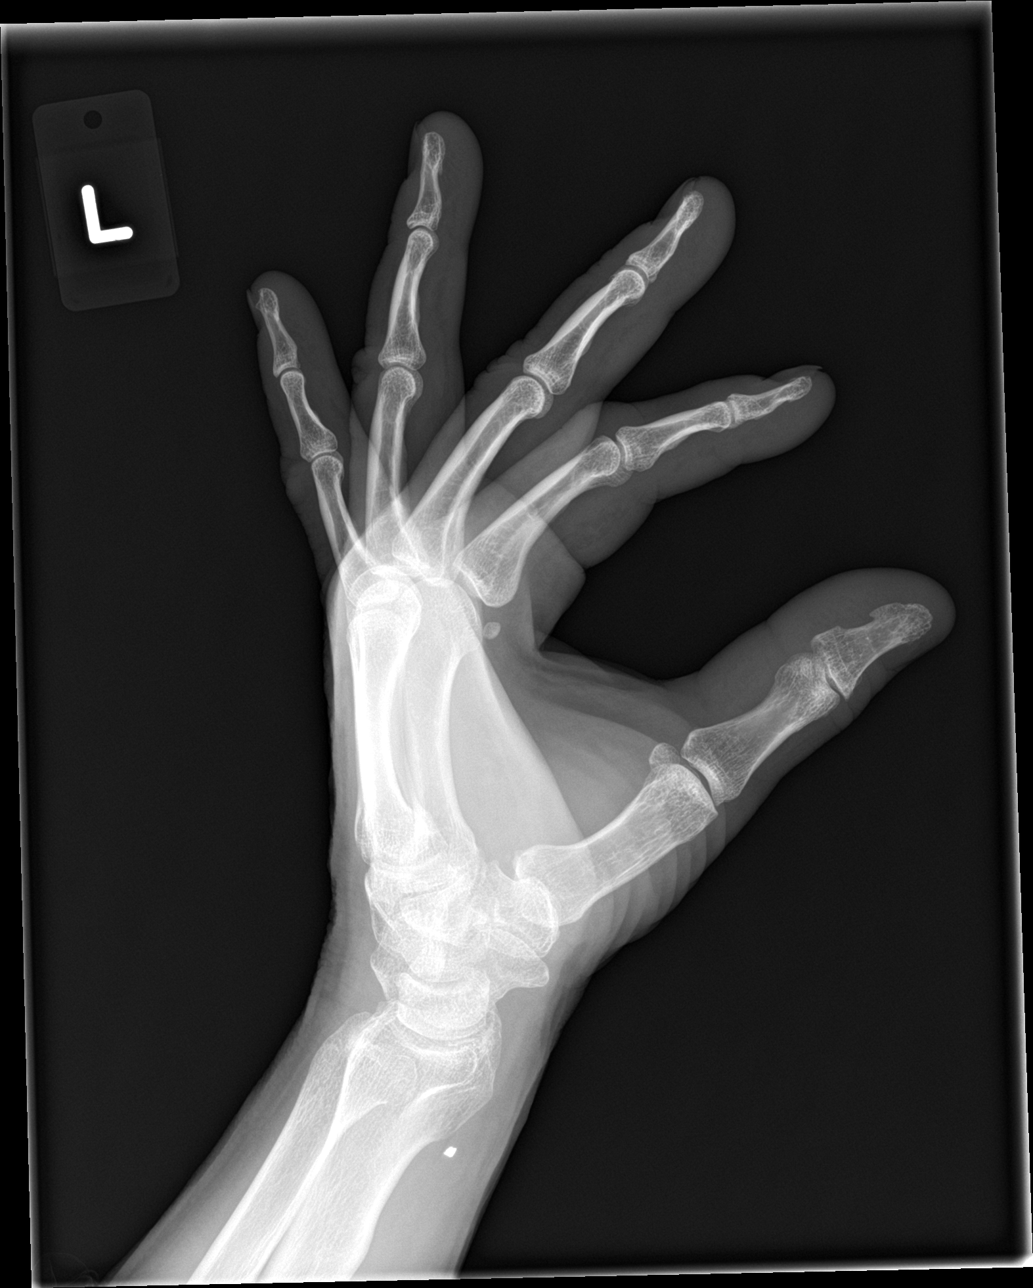

[3 of 3 positions shown; findings below may reference images not displayed]

FINDINGS: There is no evidence of fracture or dislocation. There is no
evidence of arthropathy or other focal bone abnormality. Soft
tissues are unremarkable. Radiopaque foreign body within the soft
tissues adjacent to the distal left radius is unchanged.
IMPRESSION: 1. No acute osseous abnormality.
2. Unchanged position of metallic foreign body in the ventral soft
tissues of the wrist. No new radiopaque foreign body.

## 2017-07-21 MED ORDER — CEPHALEXIN 500 MG PO CAPS: 500.0000 mg | ORAL_CAPSULE | Freq: Three times a day (TID) | ORAL | 0 refills | Status: AC

## 2017-07-21 MED ORDER — TRAMADOL HCL 50 MG PO TABS: 50.0000 mg | ORAL_TABLET | Freq: Four times a day (QID) | ORAL | 0 refills | Status: AC | PRN

## 2017-07-21 MED ORDER — TRAMADOL HCL 50 MG PO TABS
50.0000 mg | ORAL_TABLET | Freq: Once | ORAL | Status: AC
  Administered 2017-07-21: 50 mg via ORAL
  Filled 2017-07-21: qty 1

## 2017-07-21 NOTE — ED Triage Notes (Signed)
Patient had an automotive clip penetrate his left thumb and index finger,.

## 2017-07-21 NOTE — ED Provider Notes (Signed)
[facility] Emergency Department Provider Note  ____________________________________________  Time seen: Approximately 5:13 PM  I have reviewed the triage vital signs and the nursing notes.   HISTORY  Chief Complaint Hand Injury    HPI Angel Padilla is a 56 y.o. male presenting to the emergency department with concern for foreign body after sustaining a puncture of the left thumb and left index finger 1 day ago.  Patient reports pain but no radiculopathy, weakness or changes in sensation.  Patient reports his tetanus status is up-to-date.  No alleviating measures have been attempted.   Past Medical History:  Diagnosis Date  . Hypertension     There are no active problems to display for this patient.   No past surgical history on file.  Prior to Admission medications   Medication Sig Start Date End Date Taking? Authorizing Provider  atenolol (TENORMIN) 50 MG tablet Take 50 mg by mouth.    [provider]  cephALEXin (KEFLEX) 500 MG capsule Take 1 capsule (500 mg total) by mouth 3 (three) times daily. 07/21/17 07/31/17  Orvil FeilWoods, Chevy Virgo M, PA-C  cloNIDine (CATAPRES) 0.1 MG tablet Take 1 tablet (0.1 mg total) by mouth 2 (two) times daily. 01/24/15   Triplett, Cari B, FNP  gabapentin (NEURONTIN) 600 MG tablet Take 600 mg by mouth 3 (three) times daily.    [provider]  ibuprofen (ADVIL,MOTRIN) 600 MG tablet Take 600 mg by mouth every 6 (six) hours as needed.    [provider]  lisinopril (PRINIVIL,ZESTRIL) 20 MG tablet Take 20 mg by mouth daily.    [provider]    Allergies Patient has no known allergies.  No family history on file.  Social History Social History  Substance Use Topics  . Smoking status: Current Every Day Smoker  . Smokeless tobacco: Not on file  . Alcohol use No     Review of Systems  Constitutional: No fever/chills Eyes: No visual changes. No discharge ENT: No upper respiratory  complaints. Cardiovascular: no chest pain. Respiratory: no cough. No SOB. Musculoskeletal: Patient has left hand pain. Skin: Patient has puncture wound of left hand.  Neurological: Negative for headaches, focal weakness or numbness.  ____________________________________________   PHYSICAL EXAM:  VITAL SIGNS: ED Triage Vitals  Enc Vitals Group     BP --      Pulse --      Resp --      Temp --      Temp src --      SpO2 --      Weight 07/21/17 1503 180 lb (81.6 kg)     Height 07/21/17 1503 5\' 9"  (1.753 m)     Head Circumference --      Peak Flow --      Pain Score 07/21/17 1502 8     Pain Loc --      Pain Edu? --      Excl. in GC? --      Constitutional: Alert and oriented. Well appearing and in no acute distress. Eyes: Conjunctivae are normal. PERRL. EOMI. Head: Atraumatic. Cardiovascular: Normal rate, regular rhythm. Normal S1 and S2.  Good peripheral circulation. Respiratory: Normal respiratory effort without tachypnea or retractions. Lungs CTAB. Good air entry to the bases with no decreased or absent breath sounds. Musculoskeletal: Patient is able to make a fist with the left hand.  He is able to move all 5 left fingers.  No edema of the left hand.  No pain elicited  with passive extension of all 5 left fingers. Palpable radial pulse, left.  Neurologic:  Normal speech and language. No gross focal neurologic deficits are appreciated.  Skin: Patient has a puncture wound of the left thumb and left index finger. No surrounding cellulitis.  Psychiatric: Mood and affect are normal. Speech and behavior are normal. Patient exhibits appropriate insight and judgement.   ____________________________________________   LABS (all labs ordered are listed, but only abnormal results are displayed)  Labs Reviewed - No data to display ____________________________________________  EKG   ____________________________________________  RADIOLOGY Geraldo Pitter, personally viewed  and evaluated these images (plain radiographs) as part of my medical decision making, as well as reviewing the written report by the radiologist.  Dg Hand Complete Left  Result Date: 07/21/2017 CLINICAL DATA:  Multiple lacerations EXAM: LEFT HAND - COMPLETE 3+ VIEW COMPARISON:  Left hand radiograph 10/19/2013 FINDINGS: There is no evidence of fracture or dislocation. There is no evidence of arthropathy or other focal bone abnormality. Soft tissues are unremarkable. Radiopaque foreign body within the soft tissues adjacent to the distal left radius is unchanged. IMPRESSION: 1. No acute osseous abnormality. 2. Unchanged position of metallic foreign body in the ventral soft tissues of the wrist. No new radiopaque foreign body. Electronically Signed   By: Deatra Robinson M.D.   On: 07/21/2017 16:06    ____________________________________________    PROCEDURES  Procedure(s) performed:    Procedures    Medications  traMADol (ULTRAM) tablet 50 mg (not administered)     ____________________________________________   INITIAL IMPRESSION / ASSESSMENT AND PLAN / ED COURSE  Pertinent labs & imaging results that were available during my care of the patient were reviewed by me and considered in my medical decision making (see chart for details).  Review of the [place] CSRS was performed in accordance of the [facility] prior to dispensing any controlled drugs.     Assessment and Plan:  Left Hand Puncture:  Patient presents to the emergency department with a puncture wound of the left hand. X ray examination reveals no retained foreign bodies or fractures. Physical exam was reassuring. Patient was given Tramadol in the emergency department for pain. He was discharged with Tramadol. Patient was advised to follow up with primary care as needed. All patient questions were answered.    ____________________________________________  FINAL CLINICAL IMPRESSION(S) / ED DIAGNOSES  Final diagnoses:  Puncture  wound of finger of right hand, initial encounter      NEW MEDICATIONS STARTED DURING THIS VISIT:  New Prescriptions   CEPHALEXIN (KEFLEX) 500 MG CAPSULE    Take 1 capsule (500 mg total) by mouth 3 (three) times daily.        This chart was dictated using voice recognition software/Dragon. Despite best efforts to proofread, errors can occur which can change the meaning. Any change was purely unintentional.    Orvil Feil, PA-C 07/21/17 1720    Jeanmarie Plant, MD 07/21/17 2322

## 2019-12-05 ENCOUNTER — Emergency Department: Payer: Managed Care, Other (non HMO)

## 2019-12-05 ENCOUNTER — Encounter: Payer: Self-pay | Admitting: *Deleted

## 2019-12-05 ENCOUNTER — Emergency Department
Admission: EM | Admit: 2019-12-05 | Discharge: 2019-12-06 | Disposition: A | Discharge status: Home / Self Care | Payer: Managed Care, Other (non HMO) | Attending: Emergency Medicine | Admitting: Emergency Medicine

## 2019-12-05 ENCOUNTER — Other Ambulatory Visit: Payer: Self-pay

## 2019-12-05 DIAGNOSIS — R112 Nausea with vomiting, unspecified: Secondary | ICD-10-CM | POA: Insufficient documentation

## 2019-12-05 DIAGNOSIS — Z87891 Personal history of nicotine dependence: Secondary | ICD-10-CM | POA: Insufficient documentation

## 2019-12-05 DIAGNOSIS — I10 Essential (primary) hypertension: Secondary | ICD-10-CM | POA: Insufficient documentation

## 2019-12-05 DIAGNOSIS — Z79899 Other long term (current) drug therapy: Secondary | ICD-10-CM | POA: Insufficient documentation

## 2019-12-05 DIAGNOSIS — R61 Generalized hyperhidrosis: Secondary | ICD-10-CM | POA: Insufficient documentation

## 2019-12-05 DIAGNOSIS — R42 Dizziness and giddiness: Secondary | ICD-10-CM

## 2019-12-05 DIAGNOSIS — R519 Headache, unspecified: Secondary | ICD-10-CM | POA: Insufficient documentation

## 2019-12-05 LAB — CBC
HCT: 39.3 % (ref 39.0–52.0)
Hemoglobin: 13.5 g/dL (ref 13.0–17.0)
MCH: 31.9 pg (ref 26.0–34.0)
MCHC: 34.4 g/dL (ref 30.0–36.0)
MCV: 92.9 fL (ref 80.0–100.0)
Platelets: 300 10*3/uL (ref 150–400)
RBC: 4.23 MIL/uL (ref 4.22–5.81)
RDW: 12.4 % (ref 11.5–15.5)
WBC: 8.7 10*3/uL (ref 4.0–10.5)
nRBC: 0 % (ref 0.0–0.2)

## 2019-12-05 LAB — BASIC METABOLIC PANEL
Anion gap: 13 (ref 5–15)
BUN: 42 mg/dL — ABNORMAL HIGH (ref 6–20)
CO2: 21 mmol/L — ABNORMAL LOW (ref 22–32)
Calcium: 9.5 mg/dL (ref 8.9–10.3)
Chloride: 100 mmol/L (ref 98–111)
Creatinine, Ser: 1.79 mg/dL — ABNORMAL HIGH (ref 0.61–1.24)
GFR calc Af Amer: 47 mL/min — ABNORMAL LOW (ref >60)
GFR calc non Af Amer: 41 mL/min — ABNORMAL LOW (ref >60)
Glucose, Bld: 128 mg/dL — ABNORMAL HIGH (ref 70–99)
Potassium: 3.9 mmol/L (ref 3.5–5.1)
Sodium: 134 mmol/L — ABNORMAL LOW (ref 135–145)

## 2019-12-05 LAB — TROPONIN I (HIGH SENSITIVITY): Troponin I (High Sensitivity): 6 ng/L (ref <18)

## 2019-12-05 IMAGING — CT CT HEAD W/O CM
3 of 4 series · 15 of 47 positions shown, 18 images · non-contrast
Comparison: [DATE]

CLINICAL DATA: Dizziness

EXAM:
CT HEAD WITHOUT CONTRAST
TECHNIQUE: Contiguous axial images were obtained from the base of the skull
through the vertex without intravenous contrast.

[Series 3: head wo · axial · 0.44mm/px · z∈[+141,+271]mm · 9 of 32 slices shown, 12 images]
[im 3/32  brain]
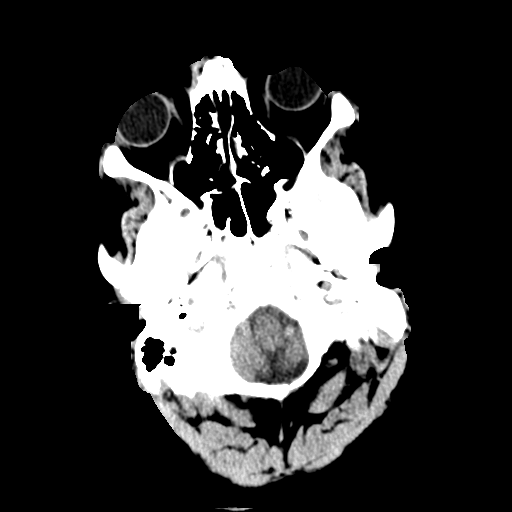
[im 3/32  bone]
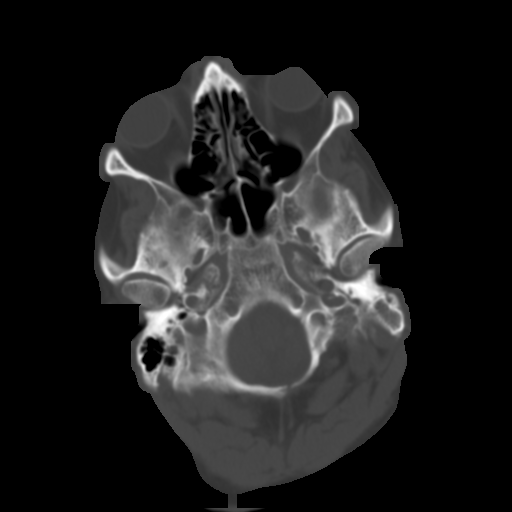
[im 7/32  brain]
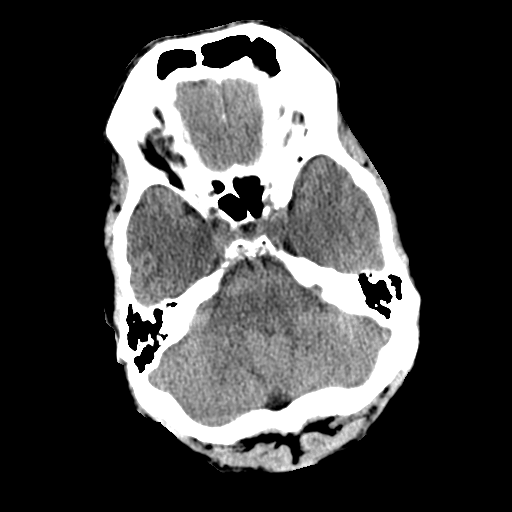
[im 9/32  brain]
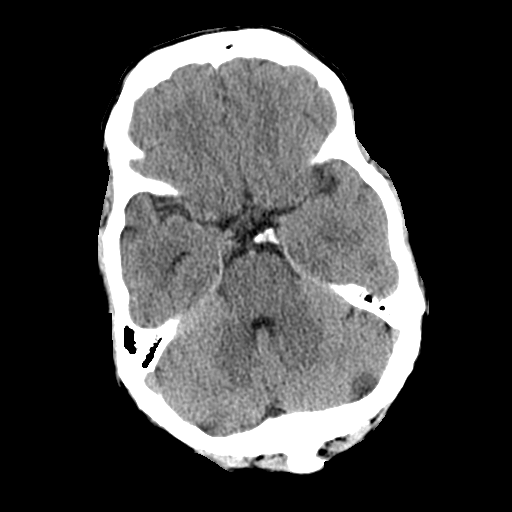
[im 14/32  brain]
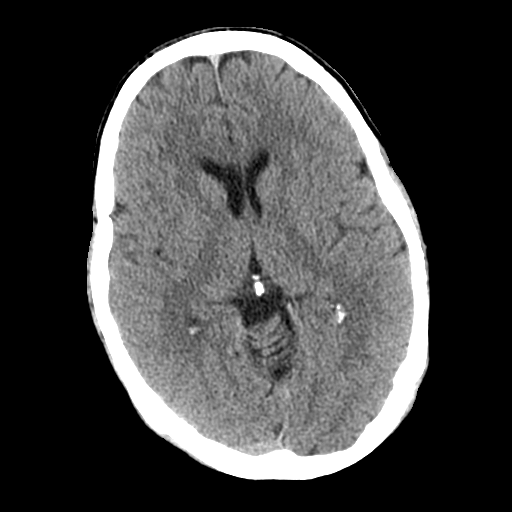
[im 16/32  brain]
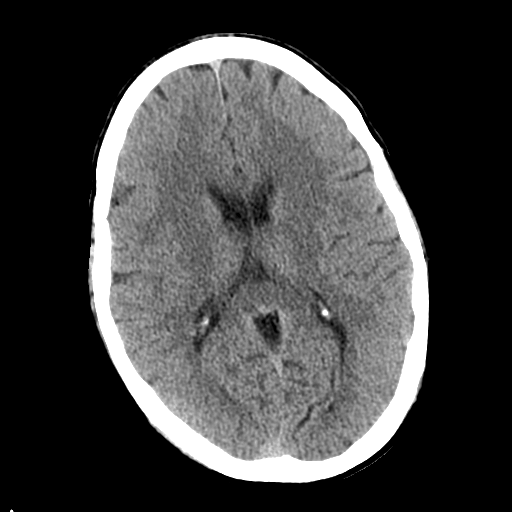
[im 16/32  bone]
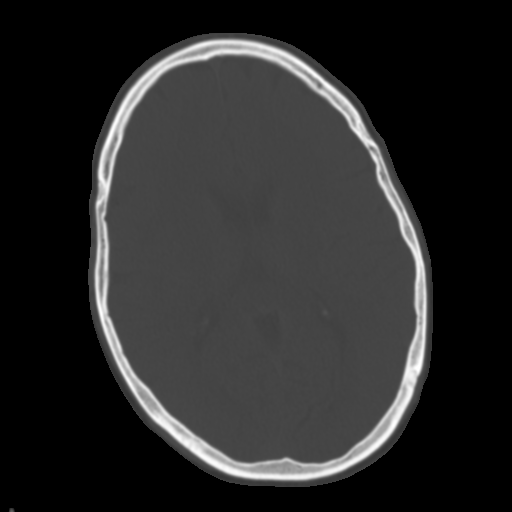
[im 18/32  brain]
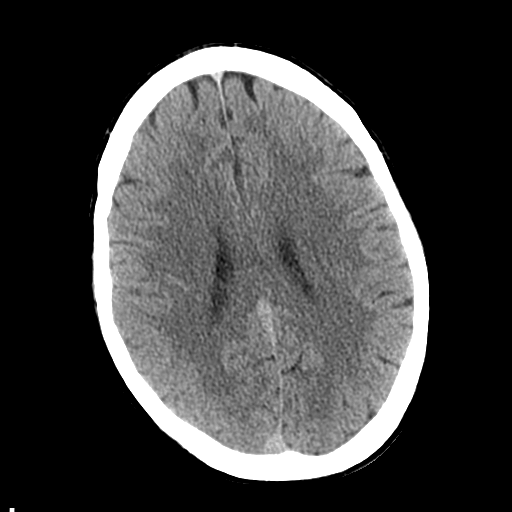
[im 23/32  brain]
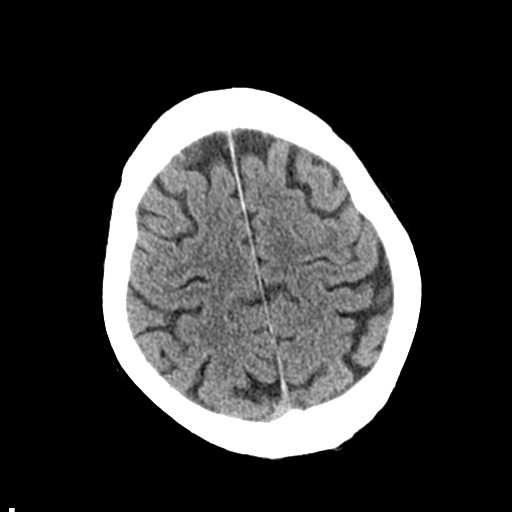
[im 25/32  brain]
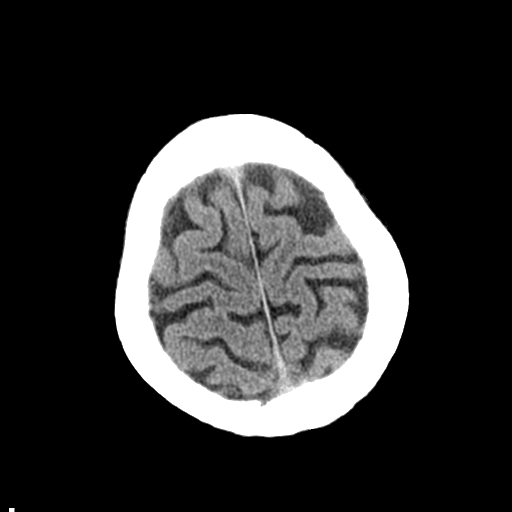
[im 29/32  brain]
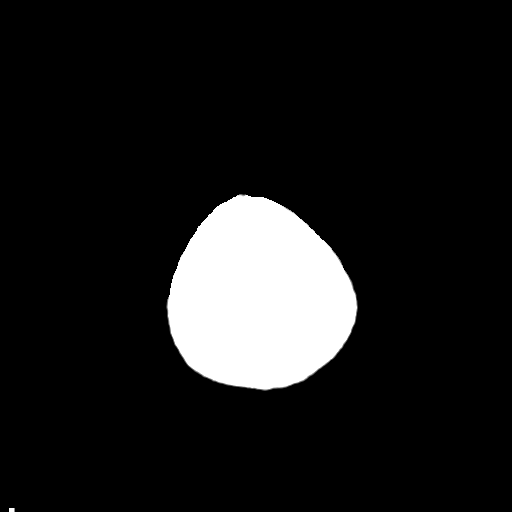
[im 29/32  bone]
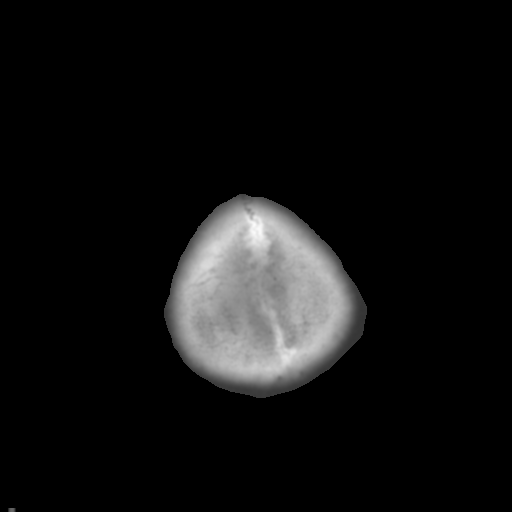

[Series 5: coronal soft tissue · coronal · 0.31mm/px · 3 of 69 slices shown]
[im 23/69  brain]
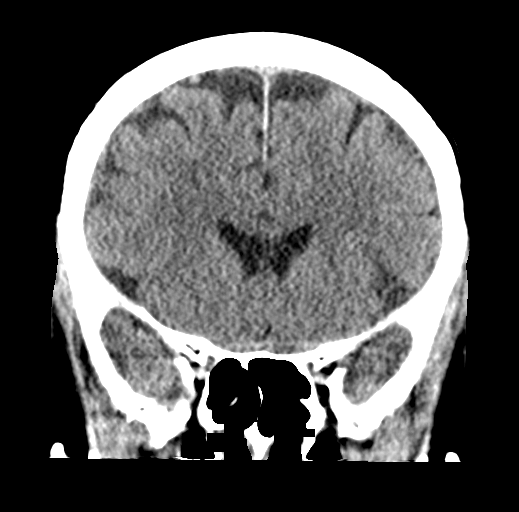
[im 31/69  brain]
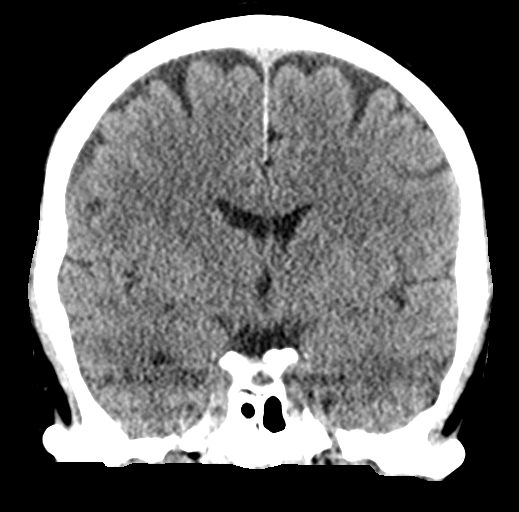
[im 38/69  brain]
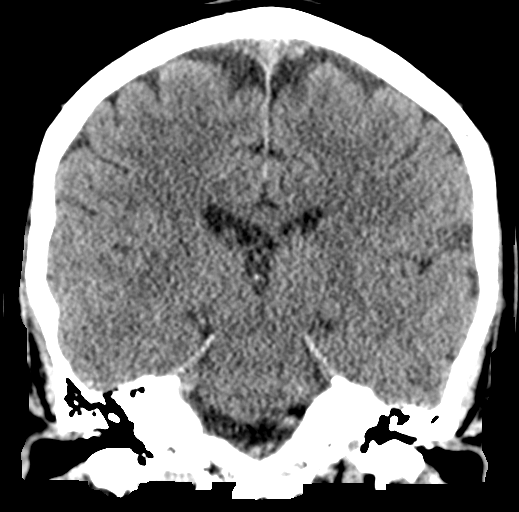

[Series 6: sagittal soft tissue · sagittal · 0.31mm/px · 3 of 52 slices shown]
[im 18/52  brain]
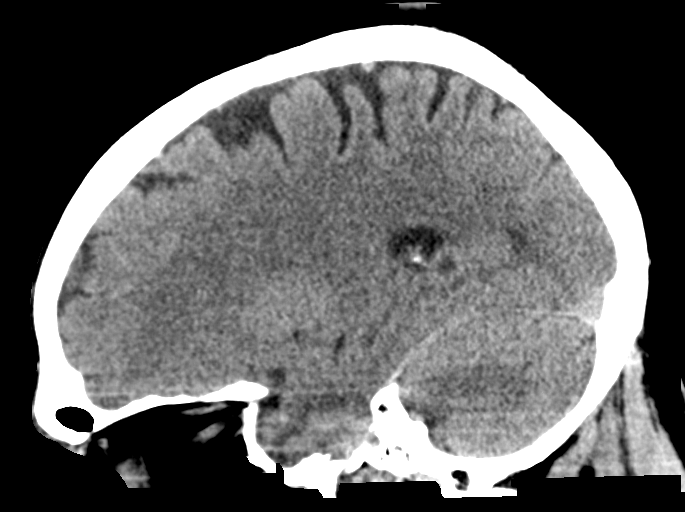
[im 26/52  brain]
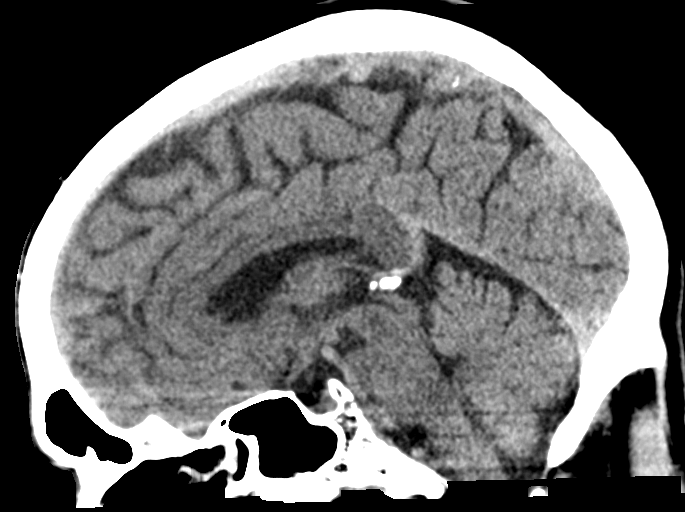
[im 35/52  brain]
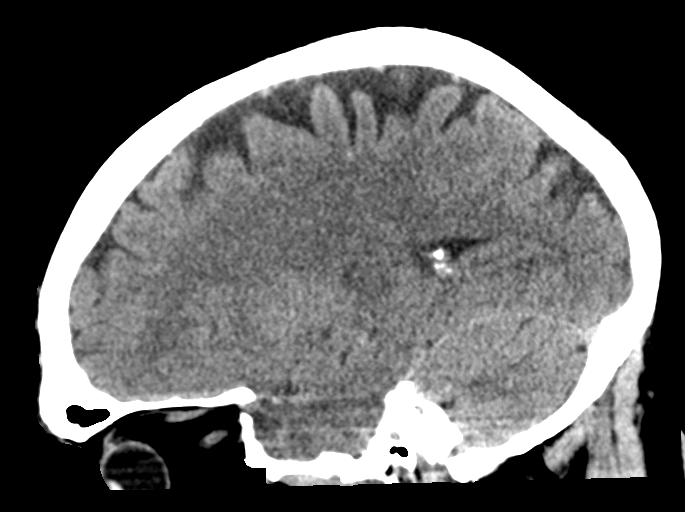

[15 of 47 positions shown; findings below may reference images not displayed]

FINDINGS: Brain: There is no mass, hemorrhage or extra-axial collection. The
size and configuration of the ventricles and extra-axial CSF spaces
are normal. The brain parenchyma is normal, without acute or chronic
infarction.

Vascular: No abnormal hyperdensity of the major intracranial
arteries or dural venous sinuses. No intracranial atherosclerosis.

Skull: The visualized skull base, calvarium and extracranial soft
tissues are normal.

Sinuses/Orbits: No fluid levels or advanced mucosal thickening of
the visualized paranasal sinuses. No mastoid or middle ear effusion.
The orbits are normal.
IMPRESSION: Normal head CT.

## 2019-12-05 MED ORDER — LORAZEPAM 2 MG/ML IJ SOLN
1.0000 mg | Freq: Once | INTRAMUSCULAR | Status: AC
  Administered 2019-12-05: 1 mg via INTRAVENOUS

## 2019-12-05 MED ORDER — SODIUM CHLORIDE 0.9 % IV BOLUS
1000.0000 mL | Freq: Once | INTRAVENOUS | Status: AC
  Administered 2019-12-05: 23:00:00 1000 mL via INTRAVENOUS

## 2019-12-05 MED ORDER — MECLIZINE HCL 25 MG PO TABS: 25.0000 mg | ORAL_TABLET | Freq: Three times a day (TID) | ORAL | 0 refills | Status: DC | PRN

## 2019-12-05 MED ORDER — SODIUM CHLORIDE 0.9% FLUSH
3.0000 mL | Freq: Once | INTRAVENOUS | Status: AC
  Administered 2019-12-05: 22:00:00 3 mL via INTRAVENOUS

## 2019-12-05 MED ORDER — LORAZEPAM 2 MG/ML IJ SOLN
INTRAMUSCULAR | Status: AC
  Filled 2019-12-05: qty 1

## 2019-12-05 MED ORDER — ONDANSETRON HCL 4 MG/2ML IJ SOLN
INTRAMUSCULAR | Status: AC
  Filled 2019-12-05: qty 2

## 2019-12-05 MED ORDER — ONDANSETRON HCL 4 MG/2ML IJ SOLN
4.0000 mg | Freq: Once | INTRAMUSCULAR | Status: AC
  Administered 2019-12-05: 4 mg via INTRAVENOUS

## 2019-12-05 MED ORDER — MECLIZINE HCL 25 MG PO TABS
25.0000 mg | ORAL_TABLET | Freq: Once | ORAL | Status: AC
  Administered 2019-12-06: 25 mg via ORAL
  Filled 2019-12-05: qty 1

## 2019-12-05 MED ORDER — PROMETHAZINE HCL 25 MG/ML IJ SOLN: 25.0000 mg | Freq: Once | INTRAMUSCULAR | Status: DC

## 2019-12-05 NOTE — ED Notes (Addendum)
Pt going to CT at this time.

## 2019-12-05 NOTE — ED Provider Notes (Signed)
[facility] Emergency Department Provider Note  Time seen: 10:19 PM  I have reviewed the triage vital signs and the nursing notes.   HISTORY  Chief Complaint Dizziness   HPI Angel Padilla is a 59 y.o. male with a past medical history of hypertension presents to the emergency department for dizziness.  According to the patient he was at home when he developed acute onset of dizziness which he describes as the room spinning around him violently.  Patient became very nauseated with vomiting.  States diaphoresis as well.  No history of vertigo.  States a mild headache.  Denies any weakness or numbness.  No recent illness or fever.  Upon arrival to the emergency department patient continues to retch loudly is mildly diaphoretic.  Holding an emesis bag.  Past Medical History:  Diagnosis Date  . Hypertension     There are no problems to display for this patient.   No past surgical history on file.  Prior to Admission medications   Medication Sig Start Date End Date Taking? Authorizing Provider  atenolol (TENORMIN) 50 MG tablet Take 50 mg by mouth.    [provider]  cloNIDine (CATAPRES) 0.1 MG tablet Take 1 tablet (0.1 mg total) by mouth 2 (two) times daily. 01/24/15   Triplett, Cari B, FNP  gabapentin (NEURONTIN) 600 MG tablet Take 600 mg by mouth 3 (three) times daily.    [provider]  ibuprofen (ADVIL,MOTRIN) 600 MG tablet Take 600 mg by mouth every 6 (six) hours as needed.    [provider]  lisinopril (PRINIVIL,ZESTRIL) 20 MG tablet Take 20 mg by mouth daily.    [provider]    No Known Allergies  No family history on file.  Social History Social History   Tobacco Use  . Smoking status: Former Smoker    Types: Cigarettes    Quit date: 12/05/2018    Years since quitting: 1.0  . Smokeless tobacco: Never Used  Substance Use Topics  . Alcohol use: No  . Drug use: Never    Review of Systems Constitutional:  Negative for fever. Cardiovascular: Negative for chest pain. Respiratory: Negative for shortness of breath. Gastrointestinal: Negative for abdominal pain.  Positive for significant nausea vomiting. Musculoskeletal: Negative for musculoskeletal complaints Neurological: Mild headache All other ROS negative  ____________________________________________   PHYSICAL EXAM:  VITAL SIGNS: ED Triage Vitals  Enc Vitals Group     BP 12/05/19 2212 (!) 155/99     Pulse Rate 12/05/19 2212 (!) 59     Resp 12/05/19 2212 16     Temp --      Temp src --      SpO2 12/05/19 2157 98 %     Weight 12/05/19 2203 185 lb (83.9 kg)     Height 12/05/19 2203 5\' 9"  (1.753 m)     Head Circumference --      Peak Flow --      Pain Score 12/05/19 2203 0     Pain Loc --      Pain Edu? --      Excl. in GC? --    Constitutional: Alert and oriented.  Mild distress due to nausea vomiting Eyes: Patient has visible nystagmus. ENT      Head: Normocephalic and atraumatic.      Mouth/Throat: Mucous membranes are moist. Cardiovascular: Normal rate, regular rhythm. No murmur Respiratory: Normal respiratory effort without tachypnea nor retractions. Breath sounds are clear  Gastrointestinal: Soft and nontender. No  distention.  Musculoskeletal: Nontender with normal range of motion in all extremities.  Neurologic:  Normal speech and language. No gross focal neurologic deficits  Skin:  Skin is warm, mildly diaphoretic. Psychiatric: Mood and affect are normal.   ____________________________________________    EKG  EKG viewed and interpreted by myself shows a normal sinus rhythm at 60 bpm with a narrow QRS, normal axis, normal intervals, no ST changes.  Normal EKG.  ____________________________________________    RADIOLOGY  CT negative  ____________________________________________   INITIAL IMPRESSION / ASSESSMENT AND PLAN / ED COURSE  Pertinent labs & imaging results that were available during my care of  the patient were reviewed by me and considered in my medical decision making (see chart for details).   Patient presents emergency department acute onset of nausea vomiting dizziness which he describes as room spinning around him.  Patient is mildly diaphoretic actively retching.  Patient keeps his eyes closed however when I open the eyes patient has visible nystagmus.  No history of BPPV previously.  We will check labs, CT scan of the head as a precaution.  We will treat with Ativan and Zofran and reassess.  Patient agreeable to plan of care.  CT scan is negative.  Lab work shows mild renal insufficiency creatinine 1.79, last creatinine of 1.1 in 2018 per care everywhere.  We will continue with IV hydration.  Highly suspect BPPV.  Patient is feeling better after medications.  [name] was evaluated in Emergency Department on 12/05/2019 for the symptoms described in the history of present illness. He was evaluated in the context of the global COVID-19 pandemic, which necessitated consideration that the patient might be at risk for infection with the SARS-CoV-2 virus that causes COVID-19. Institutional protocols and algorithms that pertain to the evaluation of patients at risk for COVID-19 are in a state of rapid change based on information released by regulatory bodies including the CDC and federal and state organizations. These policies and algorithms were followed during the patient's care in the ED.  ____________________________________________   FINAL CLINICAL IMPRESSION(S) / ED DIAGNOSES  Vertigo Dizziness   Harvest Dark, MD 12/08/19 2048

## 2019-12-05 NOTE — ED Provider Notes (Signed)
-----------------------------------------   11:47 PM on 12/05/2019 -----------------------------------------  Blood pressure (!) 147/83, pulse (!) 46, resp. rate 16, height 5\' 9"  (1.753 m), weight 83.9 kg, SpO2 99 %.  Assuming care from Dr. .  In short, Angel Padilla is a 59 y.o. male with a chief complaint of Dizziness .  Refer to the original H&P for additional details.  The current plan of care is to reassess following dose of meclizine for peripheral vertigo.  ----------------------------------------- 4:59 AM on 12/06/2019 -----------------------------------------  Patient complained of ongoing dizziness despite dose of meclizine, although nausea is improved following Zofran.  MRI brain was performed and negative for stroke at this point I suspect peripheral vertigo.  Will prescribe meclizine and Zofran, patient provided with referral to ENT.  Patient agrees with plan.    12/08/2019, MD 12/06/19 [phone]

## 2019-12-05 NOTE — ED Triage Notes (Signed)
Dizziness while prone. Pt has peaked T waves w/ diarrhea starting today. Unable to obtain orthostatics due to profound dizziness while lying.

## 2019-12-05 NOTE — Discharge Instructions (Addendum)
Please call the number to follow-up to ENT.  Return to the emergency department for any significant dizziness, vomiting.  Please use your meclizine as needed, as prescribed drink plenty of fluids obtain plenty of rest and avoid quick head movements.

## 2019-12-06 ENCOUNTER — Emergency Department: Payer: Managed Care, Other (non HMO)

## 2019-12-06 LAB — URINALYSIS, COMPLETE (UACMP) WITH MICROSCOPIC
Bacteria, UA: NONE SEEN
Bilirubin Urine: NEGATIVE
Glucose, UA: NEGATIVE mg/dL
Hgb urine dipstick: NEGATIVE
Ketones, ur: NEGATIVE mg/dL
Leukocytes,Ua: NEGATIVE
Nitrite: NEGATIVE
Protein, ur: NEGATIVE mg/dL
Specific Gravity, Urine: 1.015 (ref 1.005–1.030)
pH: 6 (ref 5.0–8.0)

## 2019-12-06 LAB — TROPONIN I (HIGH SENSITIVITY): Troponin I (High Sensitivity): 7 ng/L (ref <18)

## 2019-12-06 IMAGING — MR MR HEAD W/O CM
9 series · 38 of 48 positions shown · non-contrast
Comparison: None.

CLINICAL DATA: Ataxia. Sudden onset dizziness.

EXAM:
MRI HEAD WITHOUT CONTRAST
TECHNIQUE: Multiplanar, multiecho pulse sequences of the brain and surrounding
structures were obtained without intravenous contrast.

[Series 2: ax dwi_tracew · axial · 3.0mm · 0.60mm/px · z∈[-93,+61]mm · 5 of 48 slices shown]
[im 1/48]
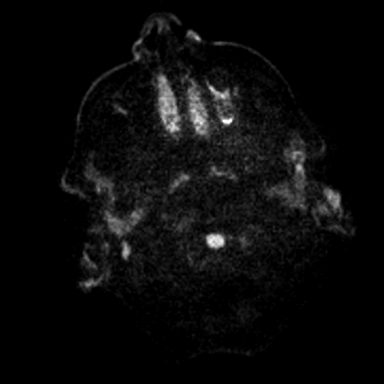
[im 12/48]
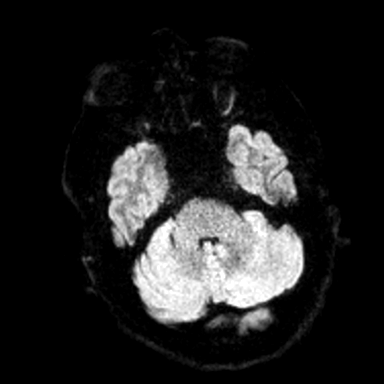
[im 24/48]
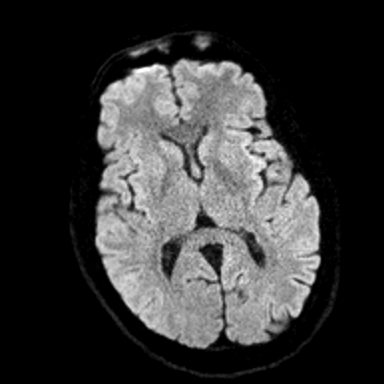
[im 36/48]
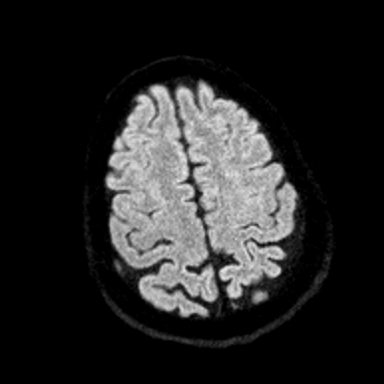
[im 48/48]
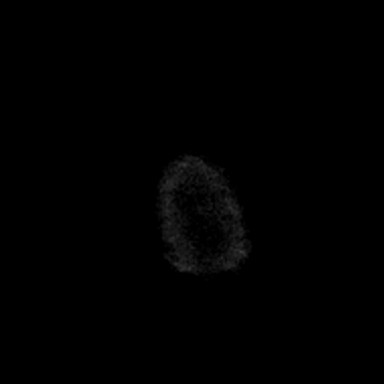

[Series 3: ax dwi_adc · axial · 3.0mm · 0.60mm/px · z∈[-93,+61]mm · 5 of 48 slices shown]
[im 1/48]
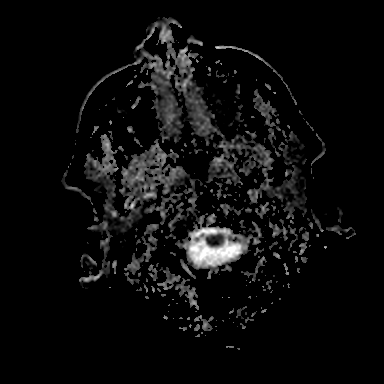
[im 12/48]
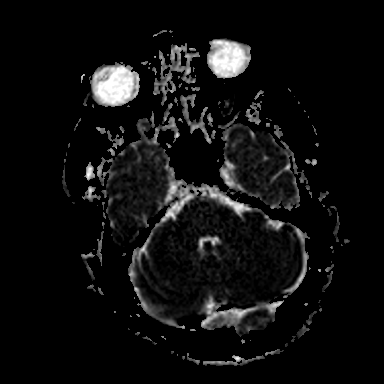
[im 24/48]
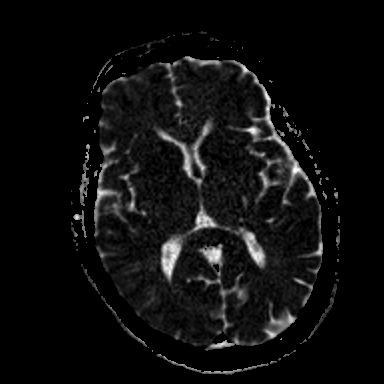
[im 36/48]
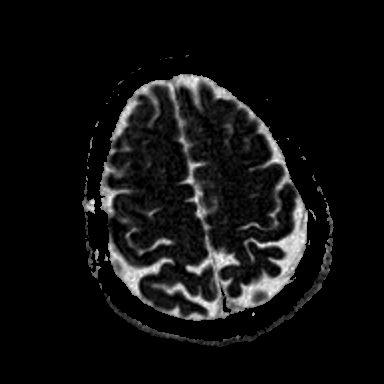
[im 48/48]
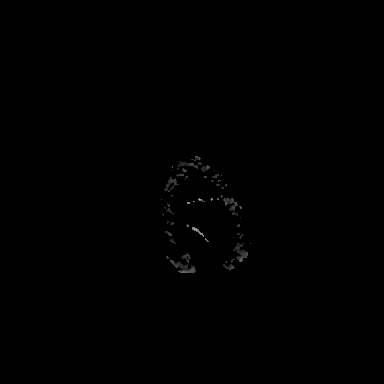

[Series 4: cor dwi_tracew · coronal · 5.0mm · 0.68mm/px · 4 of 40 slices shown]
[im 1/40]
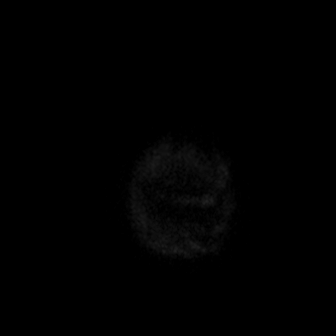
[im 14/40]
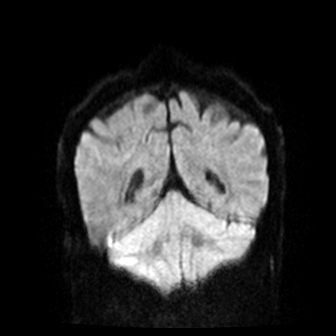
[im 27/40]
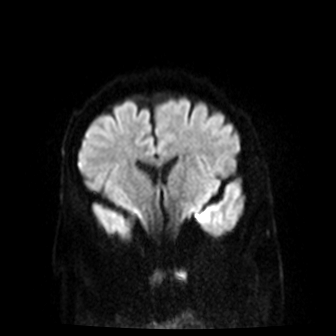
[im 40/40]
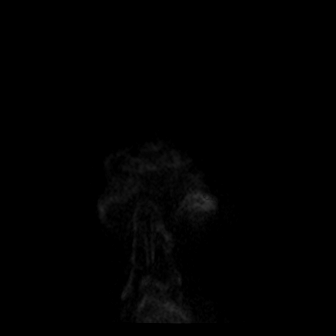

[Series 5: cor dwi_adc · coronal · 5.0mm · 0.68mm/px · 3 of 40 slices shown]
[im 1/40]
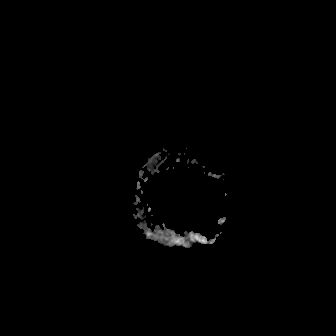
[im 14/40]
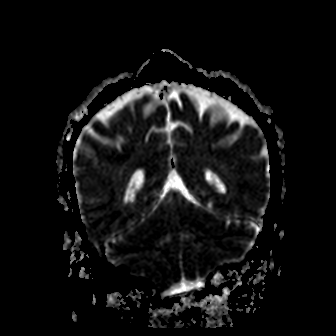
[im 27/40]
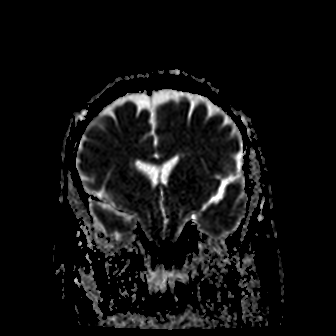

[Series 6: T1 · sagittal · 5.0mm · 0.94mm/px · 2 of 25 slices shown (1 of 2)]
[im 1/25]
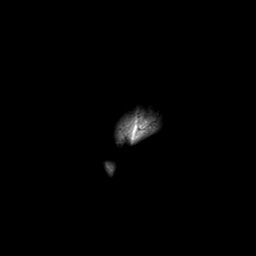
[im 25/25]
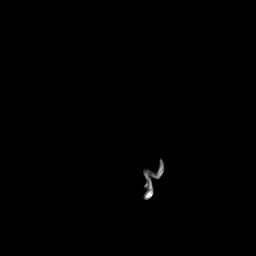

[Series 7: T2 · axial · 5.0mm · 0.45mm/px · z∈[-92,+63]mm · 3 of 27 slices shown (1 of 2)]
[im 1/27]
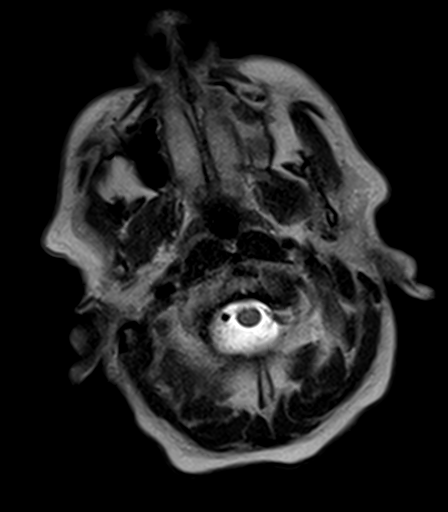
[im 14/27]
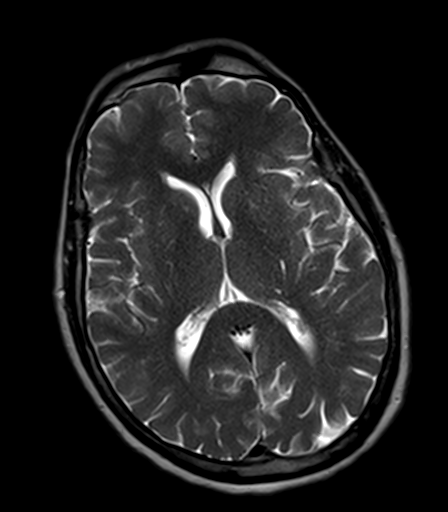
[im 27/27]
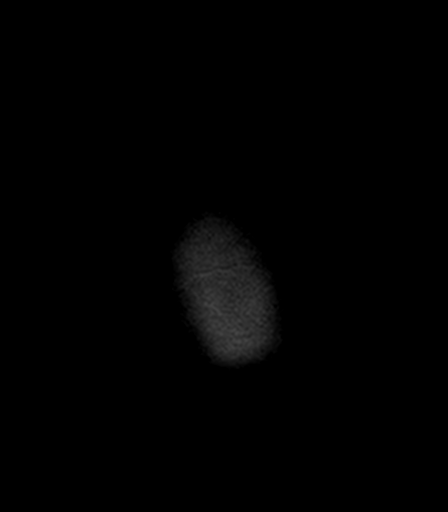

[Series 8: FLAIR · axial · 3.0mm · 0.53mm/px · z∈[-96,+65]mm · 5 of 55 slices shown]
[im 1/55]
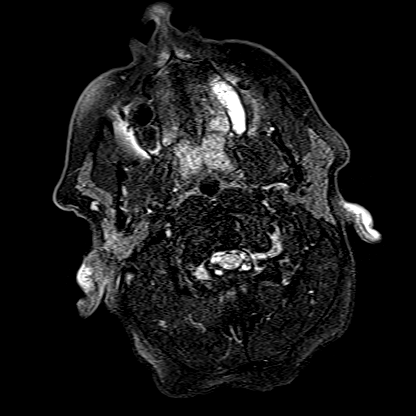
[im 14/55]
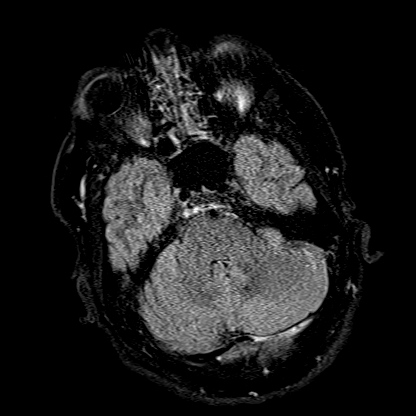
[im 28/55]
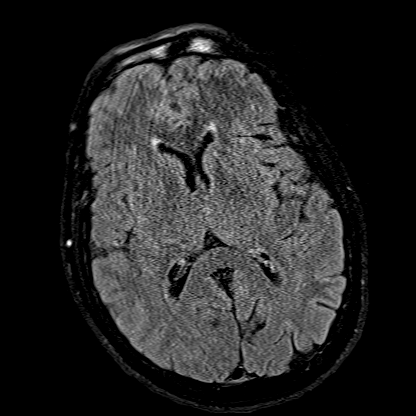
[im 41/55]
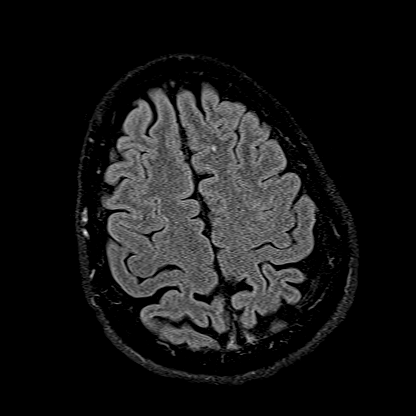
[im 55/55]
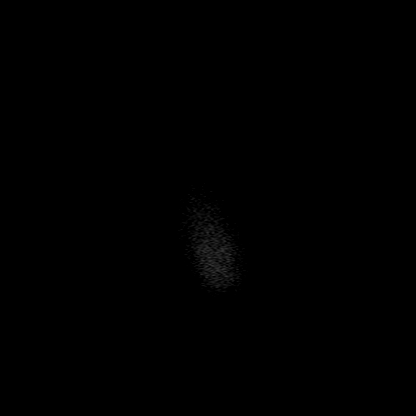

[Series 9: T1 · axial · 1.0mm · 0.98mm/px · z∈[-91,+62]mm · 8 of 176 slices shown (2 of 2)]
[im 11/176]
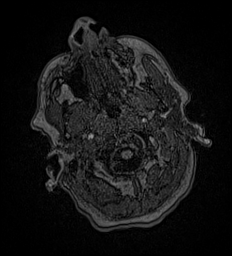
[im 33/176]
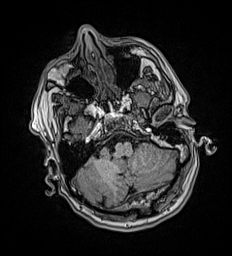
[im 55/176]
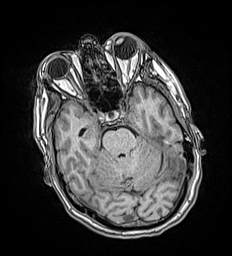
[im 77/176]
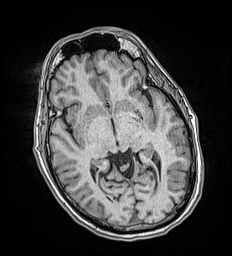
[im 99/176]
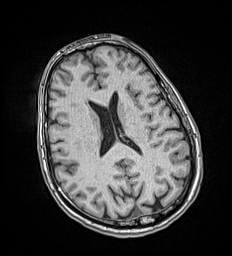
[im 121/176]
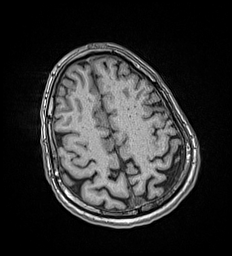
[im 143/176]
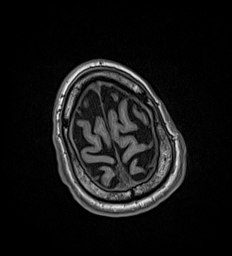
[im 165/176]
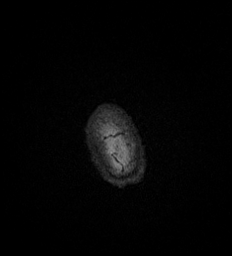

[Series 10: T2 · coronal · 5.0mm · 0.45mm/px · 3 of 31 slices shown (2 of 2)]
[im 1/31]
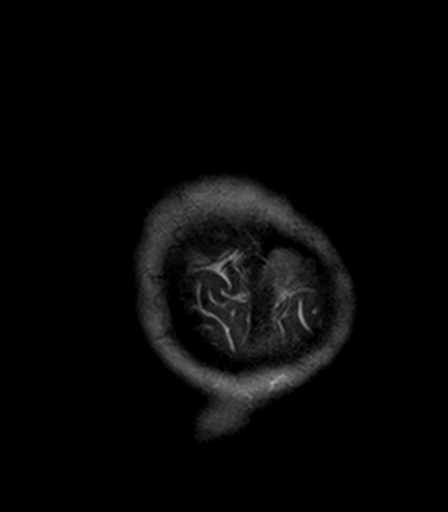
[im 16/31]
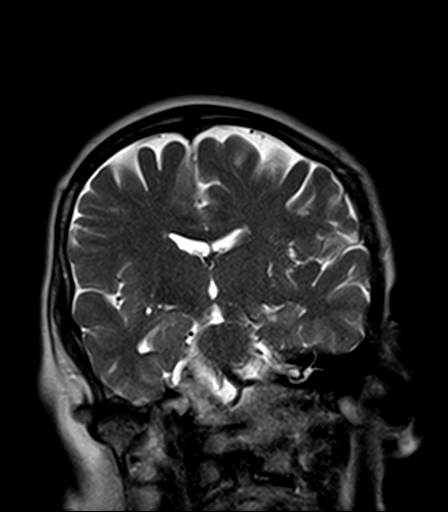
[im 31/31]
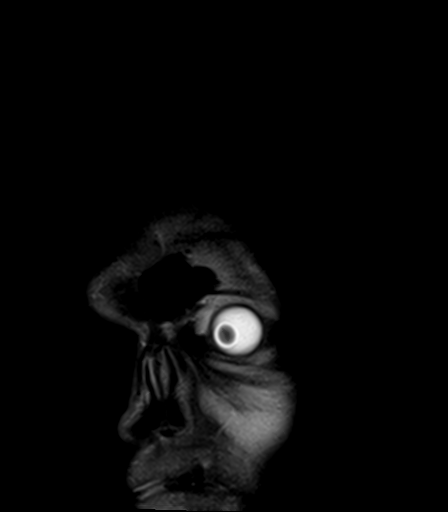

[38 of 48 positions shown; findings below may reference images not displayed]

FINDINGS: BRAIN: No acute infarct, acute hemorrhage or extra-axial collection.
Normal white matter signal for age. Normal volume of brain
parenchyma and CSF spaces. Midline structures are normal.

VASCULAR: Major flow voids are preserved. Susceptibility-sensitive
sequences show no chronic microhemorrhage or superficial siderosis.

SKULL AND UPPER CERVICAL SPINE: Normal calvarium and skull base.
Visualized upper cervical spine and soft tissues are normal.

SINUSES/ORBITS: No fluid levels or advanced mucosal thickening. No
mastoid or middle ear effusion. Normal orbits.
IMPRESSION: Normal brain MRI.

## 2019-12-06 IMAGING — CR DG ORBITS COMPLETE 4+V
2 series · 2 of 2 positions shown · non-contrast
Comparison: None.

CLINICAL DATA: 59-year-old male with MRI clearance.

EXAM:
ORBITS - COMPLETE 4+ VIEW

[orbits caldwell]
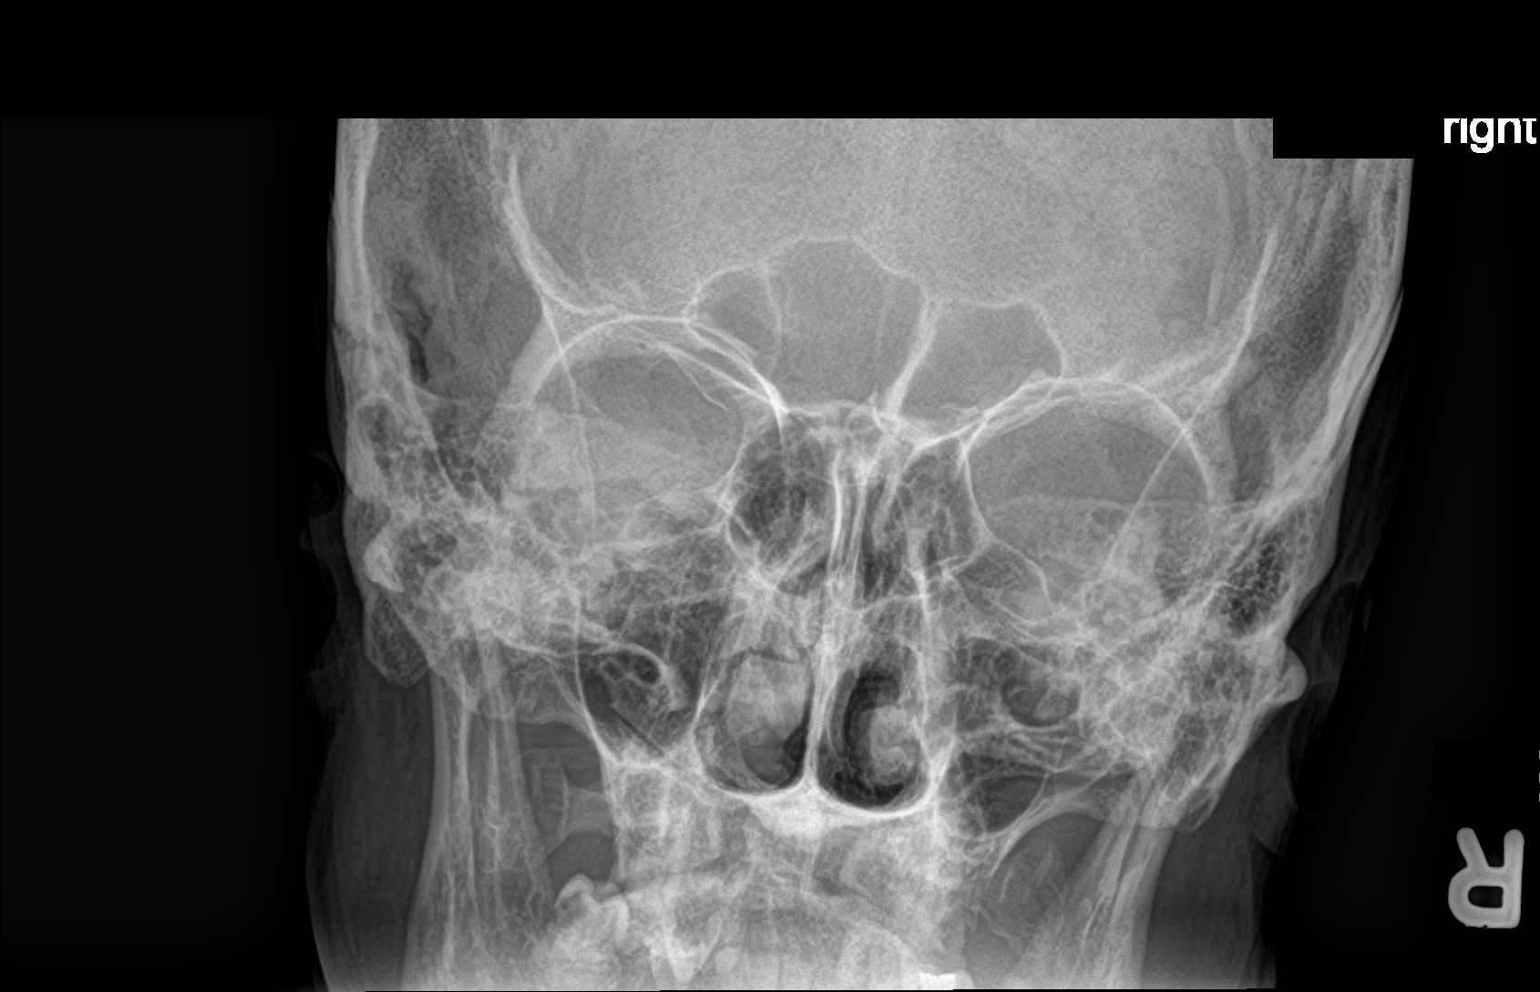

[orbits waters]
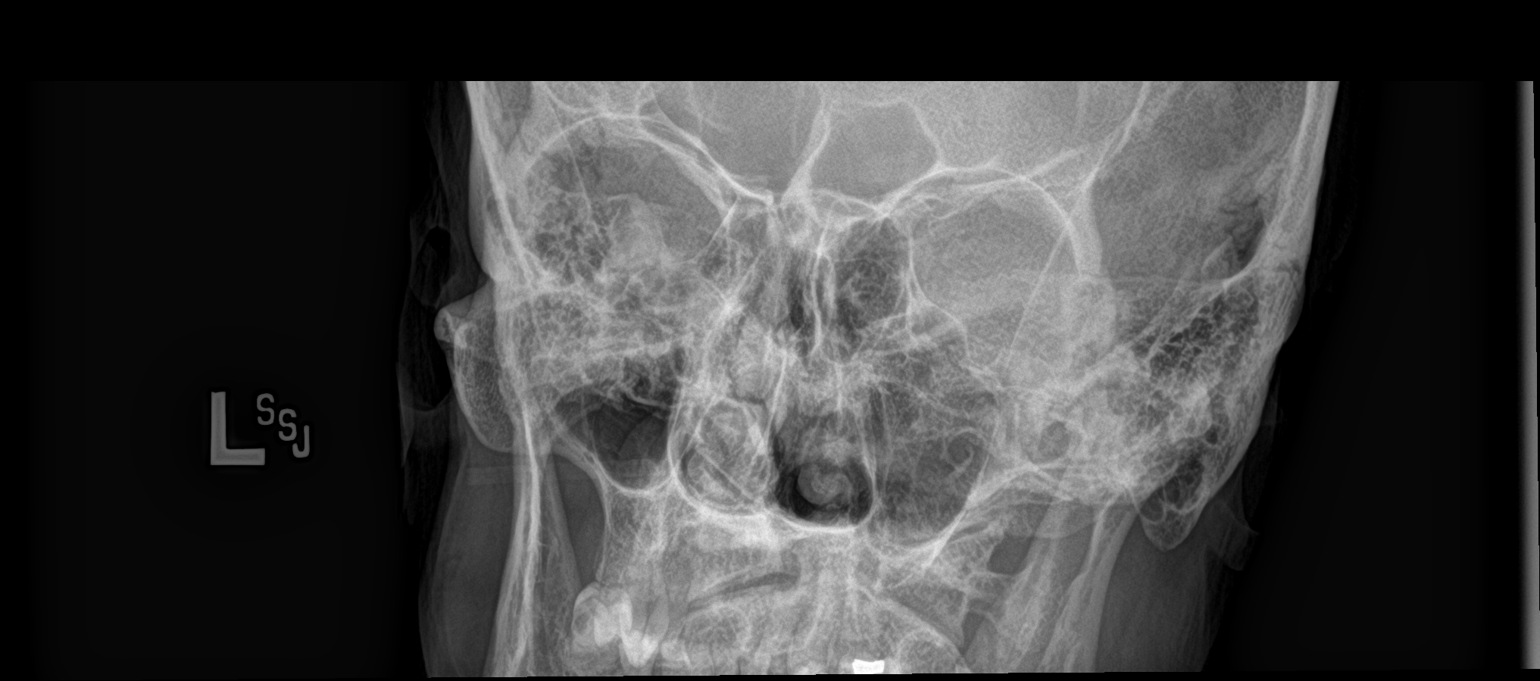

[2 of 2 positions shown; findings below may reference images not displayed]

FINDINGS: There is no evidence of metallic foreign body within the orbits. No
significant bone abnormality identified.
IMPRESSION: No evidence of metallic foreign body within the orbits.

## 2019-12-06 MED ORDER — LORAZEPAM 2 MG/ML IJ SOLN: 1.0000 mg | Freq: Four times a day (QID) | INTRAMUSCULAR | Status: DC | PRN

## 2019-12-06 MED ORDER — ONDANSETRON 4 MG PO TBDP: 4.0000 mg | ORAL_TABLET | Freq: Three times a day (TID) | ORAL | 0 refills | Status: AC | PRN

## 2019-12-06 NOTE — ED Notes (Signed)
Pt to MRI

## 2020-05-05 ENCOUNTER — Emergency Department: Payer: BC Managed Care – PPO

## 2020-05-05 ENCOUNTER — Encounter: Payer: Self-pay | Admitting: Emergency Medicine

## 2020-05-05 ENCOUNTER — Other Ambulatory Visit: Payer: Self-pay

## 2020-05-05 ENCOUNTER — Emergency Department
Admission: EM | Admit: 2020-05-05 | Discharge: 2020-05-05 | Disposition: A | Discharge status: Home / Self Care | Payer: BC Managed Care – PPO | Attending: Emergency Medicine | Admitting: Emergency Medicine

## 2020-05-05 DIAGNOSIS — Z87891 Personal history of nicotine dependence: Secondary | ICD-10-CM | POA: Diagnosis not present

## 2020-05-05 DIAGNOSIS — I10 Essential (primary) hypertension: Secondary | ICD-10-CM | POA: Insufficient documentation

## 2020-05-05 DIAGNOSIS — Z79899 Other long term (current) drug therapy: Secondary | ICD-10-CM | POA: Insufficient documentation

## 2020-05-05 DIAGNOSIS — Z87898 Personal history of other specified conditions: Secondary | ICD-10-CM

## 2020-05-05 DIAGNOSIS — R519 Headache, unspecified: Secondary | ICD-10-CM | POA: Insufficient documentation

## 2020-05-05 LAB — CBC WITH DIFFERENTIAL/PLATELET
Abs Immature Granulocytes: 0.01 10*3/uL (ref 0.00–0.07)
Basophils Absolute: 0 10*3/uL (ref 0.0–0.1)
Basophils Relative: 1 %
Eosinophils Absolute: 0.1 10*3/uL (ref 0.0–0.5)
Eosinophils Relative: 1 %
HCT: 40.9 % (ref 39.0–52.0)
Hemoglobin: 14.6 g/dL (ref 13.0–17.0)
Immature Granulocytes: 0 %
Lymphocytes Relative: 35 %
Lymphs Abs: 2.1 10*3/uL (ref 0.7–4.0)
MCH: 33 pg (ref 26.0–34.0)
MCHC: 35.7 g/dL (ref 30.0–36.0)
MCV: 92.5 fL (ref 80.0–100.0)
Monocytes Absolute: 0.6 10*3/uL (ref 0.1–1.0)
Monocytes Relative: 9 %
Neutro Abs: 3.4 10*3/uL (ref 1.7–7.7)
Neutrophils Relative %: 54 %
Platelets: 298 10*3/uL (ref 150–400)
RBC: 4.42 MIL/uL (ref 4.22–5.81)
RDW: 12.8 % (ref 11.5–15.5)
WBC: 6.2 10*3/uL (ref 4.0–10.5)
nRBC: 0 % (ref 0.0–0.2)

## 2020-05-05 LAB — URINALYSIS, COMPLETE (UACMP) WITH MICROSCOPIC
Bacteria, UA: NONE SEEN
Bilirubin Urine: NEGATIVE
Glucose, UA: NEGATIVE mg/dL
Hgb urine dipstick: NEGATIVE
Ketones, ur: NEGATIVE mg/dL
Leukocytes,Ua: NEGATIVE
Nitrite: NEGATIVE
Protein, ur: NEGATIVE mg/dL
Specific Gravity, Urine: 1.016 (ref 1.005–1.030)
WBC, UA: NONE SEEN WBC/hpf (ref 0–5)
pH: 6 (ref 5.0–8.0)

## 2020-05-05 LAB — LACTIC ACID, PLASMA
Lactic Acid, Venous: 2 mmol/L (ref 0.5–1.9)
Lactic Acid, Venous: 1.8 mmol/L (ref 0.5–1.9)

## 2020-05-05 LAB — COMPREHENSIVE METABOLIC PANEL
ALT: 41 U/L (ref 0–44)
AST: 35 U/L (ref 15–41)
Albumin: 4.5 g/dL (ref 3.5–5.0)
Alkaline Phosphatase: 64 U/L (ref 38–126)
Anion gap: 11 (ref 5–15)
BUN: 18 mg/dL (ref 6–20)
CO2: 24 mmol/L (ref 22–32)
Calcium: 9.8 mg/dL (ref 8.9–10.3)
Chloride: 101 mmol/L (ref 98–111)
Creatinine, Ser: 1.28 mg/dL — ABNORMAL HIGH (ref 0.61–1.24)
GFR calc Af Amer: >60 mL/min (ref >60)
GFR calc non Af Amer: >60 mL/min (ref >60)
Glucose, Bld: 119 mg/dL — ABNORMAL HIGH (ref 70–99)
Potassium: 4.4 mmol/L (ref 3.5–5.1)
Sodium: 136 mmol/L (ref 135–145)
Total Bilirubin: 0.9 mg/dL (ref 0.3–1.2)
Total Protein: 7.8 g/dL (ref 6.5–8.1)

## 2020-05-05 IMAGING — CR DG CHEST 2V
2 series · 2 of 2 positions shown · non-contrast
Comparison: [DATE]

CLINICAL DATA: Shortness of breath

EXAM:
CHEST - 2 VIEW

[chest pa]
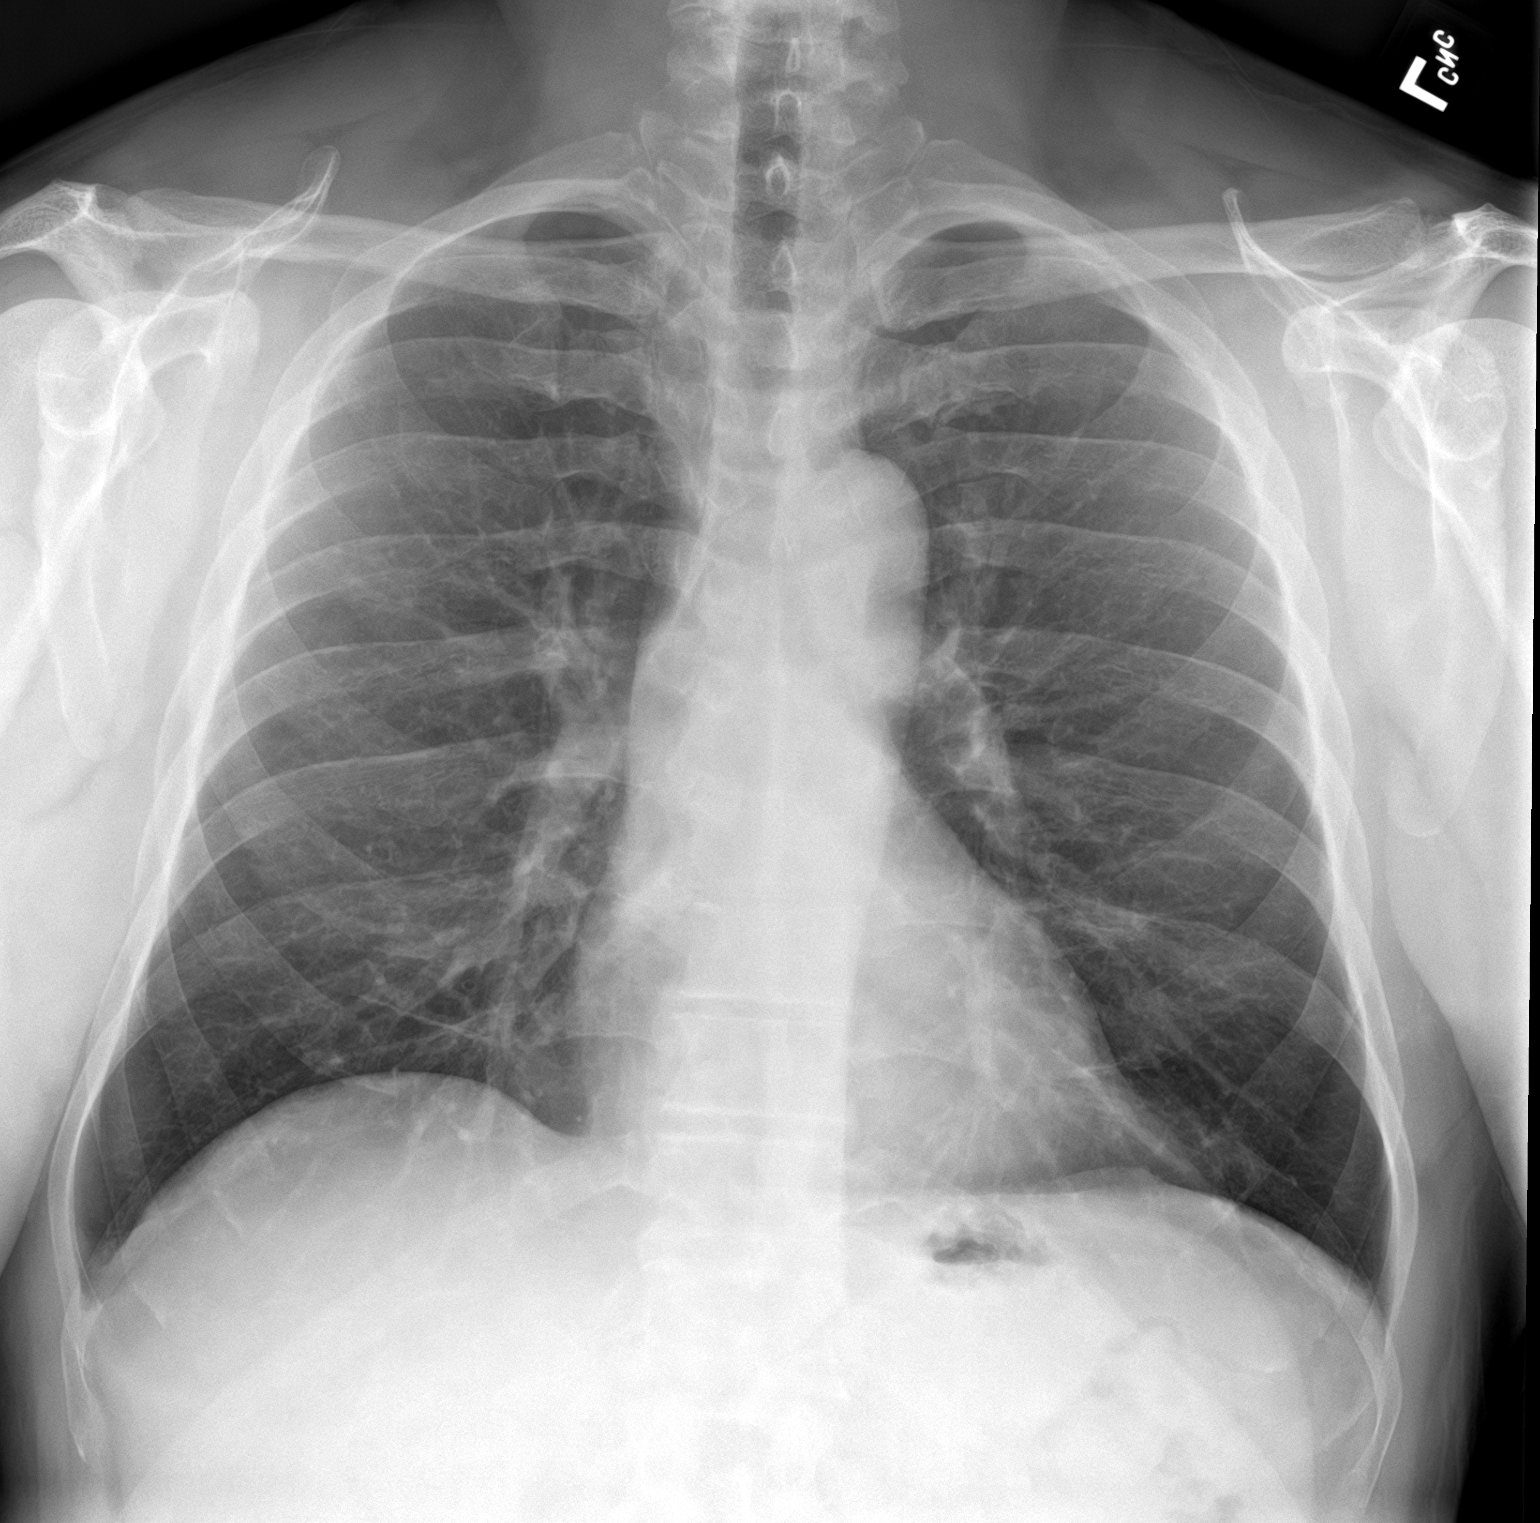

[chest lat]
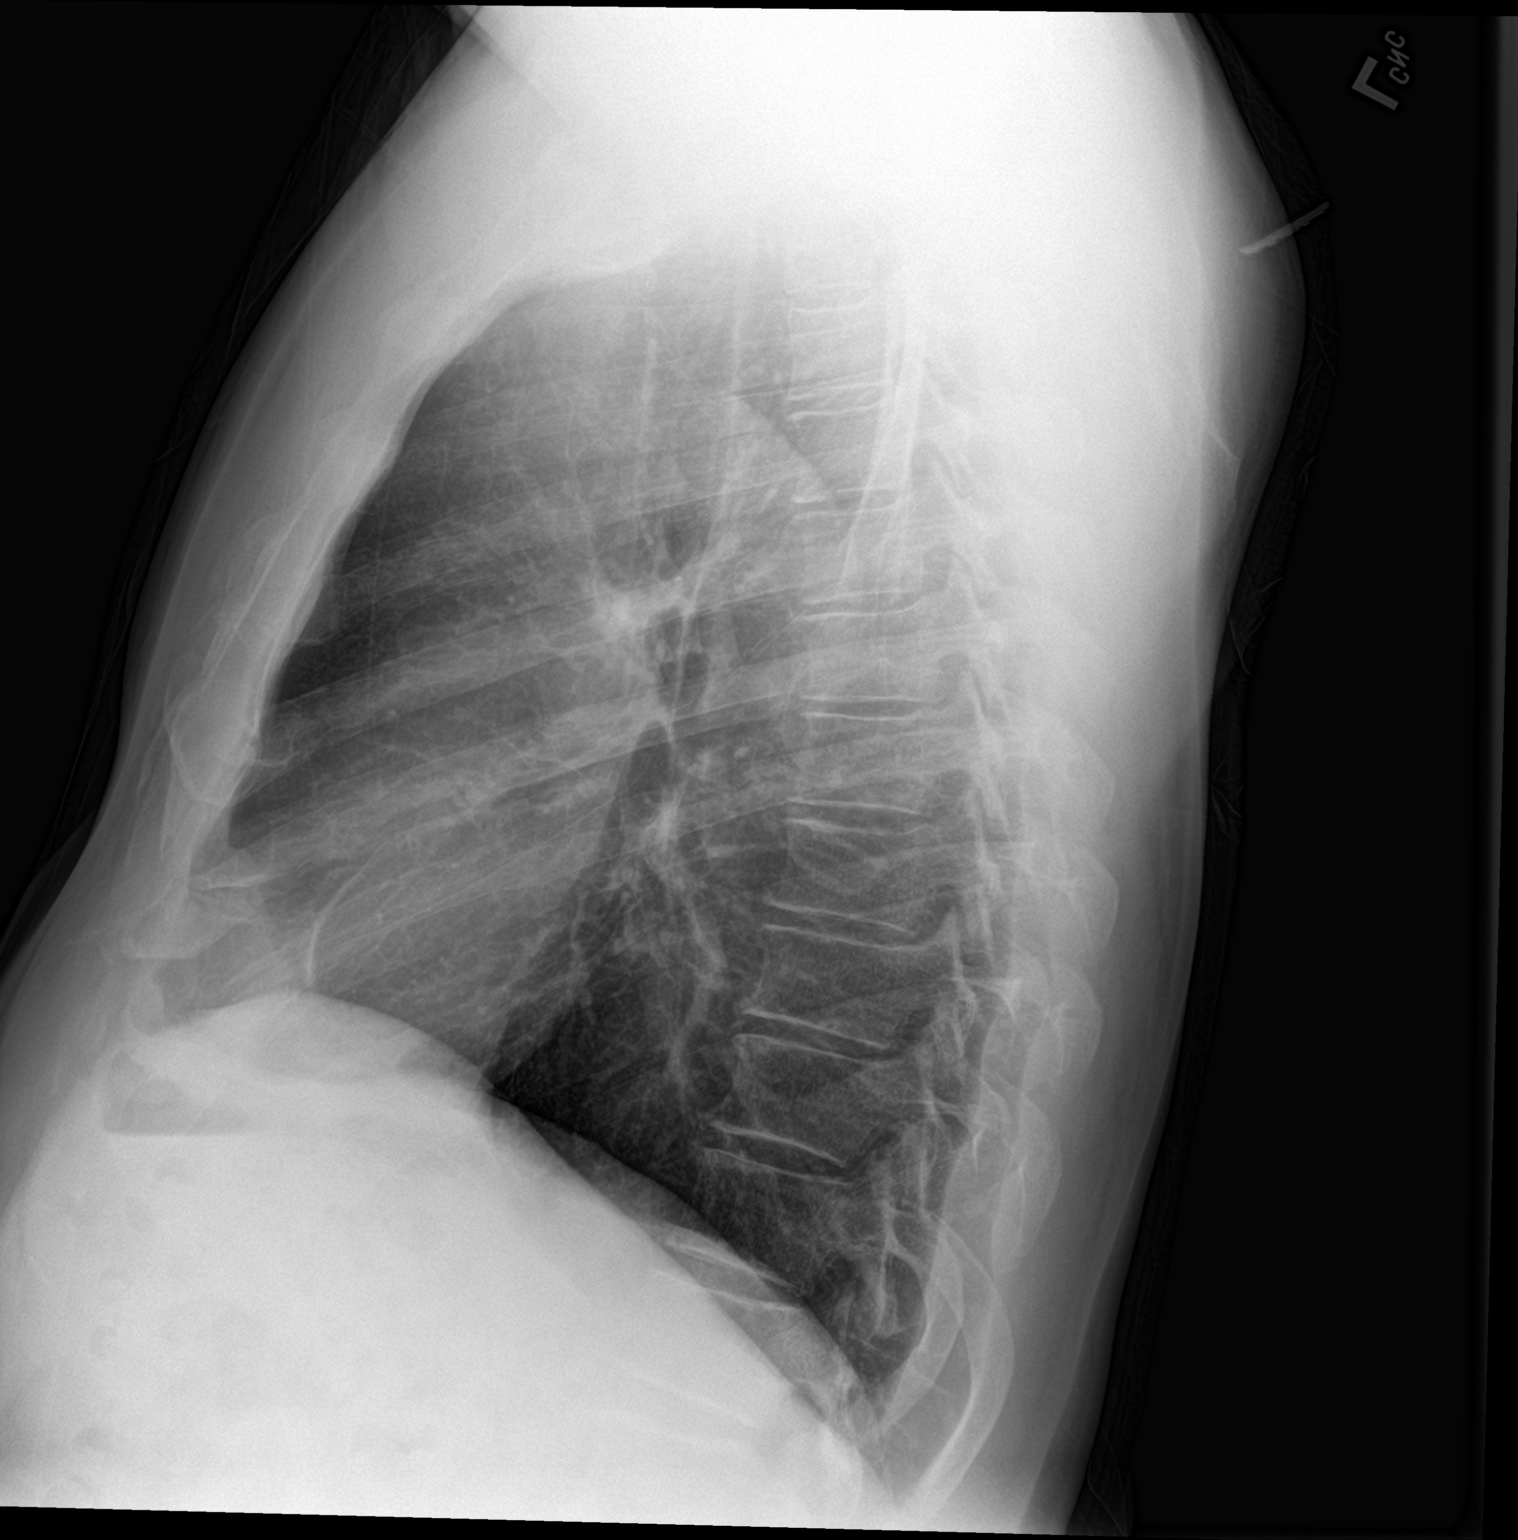

[2 of 2 positions shown; findings below may reference images not displayed]

FINDINGS: The lungs are clear. The heart size and pulmonary vascularity are
normal. No adenopathy. No bone lesions.
IMPRESSION: Lungs clear.  Cardiac silhouette normal.

## 2020-05-05 NOTE — ED Triage Notes (Signed)
Pt here with c/o headache, stiff/pain in neck, occasional shob, states his daughter was diagnosed here a few days ago with viral meningitis, doesn't think he's had a fever, some mild congestion this am. NAD.

## 2020-05-05 NOTE — ED Provider Notes (Signed)
[facility] Emergency Department Provider Note   ____________________________________________   First MD Initiated Contact with Patient 05/05/20 1715     (approximate)  I have reviewed the triage vital signs and the nursing notes.   HISTORY  Chief Complaint Headache, Torticollis, and Shortness of Breath    HPI Angel Padilla is a 59 y.o. male who reports he is daughter was diagnosed with viral meningitis recently.  He of course has been exposed to her.  He complains of having had a stiff neck and a headache and occasional shortness of breath.  He does not have a fever today.  He does not have a stiff neck or headache today and he is not short of breath.  He is not coughing.  I offered him a Covid test and he declined.  Again he does not have a fever.  His lactic acid is normal.  His white count is normal.  His differential is normal.  His neck is not stiff he has no pain with eye movements.  He looks well.         Past Medical History:  Diagnosis Date  . Hypertension     There are no problems to display for this patient.   History reviewed. No pertinent surgical history.  Prior to Admission medications   Medication Sig Start Date End Date Taking? Authorizing Provider  atenolol (TENORMIN) 50 MG tablet Take 50 mg by mouth.    [provider]  cloNIDine (CATAPRES) 0.1 MG tablet Take 1 tablet (0.1 mg total) by mouth 2 (two) times daily. 01/24/15   Triplett, Cari B, FNP  gabapentin (NEURONTIN) 600 MG tablet Take 600 mg by mouth 3 (three) times daily.    [provider]  ibuprofen (ADVIL,MOTRIN) 600 MG tablet Take 600 mg by mouth every 6 (six) hours as needed.    [provider]  lisinopril (PRINIVIL,ZESTRIL) 20 MG tablet Take 20 mg by mouth daily.    [provider]  meclizine (ANTIVERT) 25 MG tablet Take 1 tablet (25 mg total) by mouth 3 (three) times daily as needed for dizziness. 12/05/19   Minna Antis, MD    ondansetron (ZOFRAN ODT) 4 MG disintegrating tablet Take 1 tablet (4 mg total) by mouth every 8 (eight) hours as needed for nausea or vomiting. 12/06/19   Chesley Noon, MD    Allergies Patient has no known allergies.  No family history on file.  Social History Social History   Tobacco Use  . Smoking status: Former Smoker    Types: Cigarettes    Quit date: 12/05/2018    Years since quitting: 1.4  . Smokeless tobacco: Never Used  Vaping Use  . Vaping Use: Never used  Substance Use Topics  . Alcohol use: No  . Drug use: Never    Review of Systems  Constitutional: No fever/chills Eyes: No visual changes. ENT: No sore throat. Cardiovascular: Denies chest pain. Respiratory: Denies shortness of breath. Gastrointestinal: No abdominal pain.  No nausea, no vomiting.  No diarrhea.  No constipation. Genitourinary: Negative for dysuria. Musculoskeletal: Negative for back pain. Skin: Negative for rash. Neurological: Negative for headaches, focal weakness  ____________________________________________   PHYSICAL EXAM:  VITAL SIGNS: ED Triage Vitals  Enc Vitals Group     BP 05/05/20 1245 132/82     Pulse Rate 05/05/20 1237 (!) 51     Resp 05/05/20 1237 18     Temp 05/05/20 1237 98.4 F (36.9 C)     Temp Source  05/05/20 1237 Oral     SpO2 05/05/20 1237 98 %     Weight 05/05/20 1238 202 lb (91.6 kg)     Height 05/05/20 1238 5\' 9"  (1.753 m)     Head Circumference --      Peak Flow --      Pain Score 05/05/20 1238 7     Pain Loc --      Pain Edu? --      Excl. in GC? --     Constitutional: Alert and oriented. Well appearing and in no acute distress. Eyes: Conjunctivae are normal. PER. EOMI. no pain with eye movement Head: Atraumatic. Nose: No congestion/rhinnorhea. Mouth/Throat: Mucous membranes are moist.  Oropharynx non-erythematous. Neck: No stridor.  No cervical spine tenderness to palpation. Hematological/Lymphatic/Immunilogical: No cervical  lymphadenopathy. Cardiovascular: Normal rate, regular rhythm. Grossly normal heart sounds.  Good peripheral circulation. Respiratory: Normal respiratory effort.  No retractions. Lungs CTAB. Gastrointestinal: Soft and nontender. No distention. No abdominal bruits. No CVA tenderness. Musculoskeletal: No lower extremity tenderness nor edema.   Neurologic:  Normal speech and language. No gross focal neurologic deficits are appreciated.  Cranial nerves II through XII are intact although visual fields were not checked cerebellar finger-nose is normal motor strength is 5/5 throughout patient walks normally and has no reports of numbness.  No gait instability. Skin:  Skin is warm, dry and intact. No rash noted. Psychiatric: Mood and affect are normal. Speech and behavior are normal.  ____________________________________________   LABS (all labs ordered are listed, but only abnormal results are displayed)  Labs Reviewed  LACTIC ACID, PLASMA - Abnormal; Notable for the following components:      Result Value   Lactic Acid, Venous 2.0 (*)    All other components within normal limits  COMPREHENSIVE METABOLIC PANEL - Abnormal; Notable for the following components:   Glucose, Bld 119 (*)    Creatinine, Ser 1.28 (*)    All other components within normal limits  URINALYSIS, COMPLETE (UACMP) WITH MICROSCOPIC - Abnormal; Notable for the following components:   Color, Urine YELLOW (*)    APPearance HAZY (*)    All other components within normal limits  LACTIC ACID, PLASMA  CBC WITH DIFFERENTIAL/PLATELET   ____________________________________________  EKG  EKG read interpreted by me shows sinus bradycardia rate of 48 normal axis essentially normal EKG except for the bradycardia ____________________________________________  RADIOLOGY  ED MD interpretation: Chest x-ray read by radiology reviewed by me is clear  Official radiology report(s): DG Chest 2 View  Result Date: 05/05/2020 CLINICAL  DATA:  Shortness of breath EXAM: CHEST - 2 VIEW COMPARISON:  January 15, 2007 FINDINGS: The lungs are clear. The heart size and pulmonary vascularity are normal. No adenopathy. No bone lesions. IMPRESSION: Lungs clear.  Cardiac silhouette normal. Electronically Signed   By: January 17, 2007 III M.D.   On: 05/05/2020 13:25    ____________________________________________   PROCEDURES  Procedure(s) performed (including Critical Care):  Procedures   ____________________________________________   INITIAL IMPRESSION / ASSESSMENT AND PLAN / ED COURSE Patient has no pleuritic chest pain no edema he looks well and is not tachycardic.  I will let him go he can return for any further problems.              ____________________________________________   FINAL CLINICAL IMPRESSION(S) / ED DIAGNOSES  Final diagnoses:  History of headache     ED Discharge Orders    None       Note:  This document was prepared  using Conservation officer, historic buildings and may include unintentional dictation errors.    Arnaldo Natal, MD 05/05/20 1756

## 2020-05-05 NOTE — Discharge Instructions (Signed)
Please return for high fever over 101 or feeling short of breath or feeling sicker or having a severe headache.

## 2020-09-09 DIAGNOSIS — R519 Headache, unspecified: Secondary | ICD-10-CM | POA: Insufficient documentation

## 2020-09-09 DIAGNOSIS — H8110 Benign paroxysmal vertigo, unspecified ear: Secondary | ICD-10-CM | POA: Insufficient documentation

## 2020-09-09 DIAGNOSIS — R42 Dizziness and giddiness: Secondary | ICD-10-CM | POA: Insufficient documentation

## 2020-09-09 DIAGNOSIS — M436 Torticollis: Secondary | ICD-10-CM | POA: Insufficient documentation

## 2020-11-10 DIAGNOSIS — H9313 Tinnitus, bilateral: Secondary | ICD-10-CM | POA: Insufficient documentation

## 2020-12-22 ENCOUNTER — Other Ambulatory Visit: Payer: Self-pay

## 2020-12-22 ENCOUNTER — Ambulatory Visit: Payer: BC Managed Care – PPO | Attending: Neurology

## 2020-12-22 DIAGNOSIS — R2681 Unsteadiness on feet: Secondary | ICD-10-CM | POA: Diagnosis present

## 2020-12-22 DIAGNOSIS — R42 Dizziness and giddiness: Secondary | ICD-10-CM | POA: Insufficient documentation

## 2020-12-22 NOTE — Therapy (Signed)
[facility] [address] [place], [place], [address] Phone: [phone]   Fax:  [phone]  Physical Therapy Evaluation  Patient Details  Name: Angel Padilla MRN: [id number] Date of Birth: 11-Sep-1961 Referring Provider (PT): Dr. Malvin Johns   Encounter Date: 12/22/2020   PT End of Session - 12/22/20 1245    Visit Number 1    Number of Visits 13    Date for PT Re-Evaluation 03/16/21    Authorization Type eval: 12/22/20    PT Start Time 1017    PT Stop Time 1110    PT Time Calculation (min) 53 min    Activity Tolerance Patient tolerated treatment well    Behavior During Therapy [facility] for tasks assessed/performed           Past Medical History:  Diagnosis Date  . Hypertension     History reviewed. No pertinent surgical history.  There were no vitals filed for this visit.    Subjective Assessment - 12/22/20 1234    Subjective Dizziness    Pertinent History Pt reports that he had a bad spell of severe vertigo with nausea and vomiting about a year ago. He went to the emergency department at [facility] (date in medical record is 12/05/19).  He was given meclizine and zofran. MRI brain was performed which was negative for any acute changes. He was discharged with instructions to follow-up with ENT. Pt states that he was also having double vision when symptoms first started however that has improved. The dizziness has been progressively worse since that time especially over the last 4-5 months. Riding in the car is especially difficult and causes a lot of dizziness. 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 He has been experiencing bilateral tinnitus as well as headaches since the symptoms started. He has a history of migraine headaches as a child but has not had migraines in adulthood. However, he has been having headaches with these symptoms but state that they are not  migraines. He is being followed by neurology who has prescribed nortriptyline and diazepam. He doesn't like taking the diazepam because it makes him feel drowsy. He states that the symptoms make him feel fatigued and he sleeps a lot. He was previously very active exercising and bodybuilding at the gym but has mostly stopped due to his symptoms. He reports taking medications for depression due to these symptoms but this has impacted his libido. 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 Unclear if pt had a VNG study but unable to find in the medical record. He does report that he had a hearing test with some hearing loss bilaterally but it was symmetrical.    Limitations Walking    Diagnostic tests Brain MRI: WNL    Patient Stated Goals Decrease symptoms, return to weightlifting/bodybuilding    Currently in Pain? Other (Comment)   Pt denies pain but unrelated to current episode             [facility] PT Assessment - 12/22/20 1243      Assessment   Medical Diagnosis Dizziness and giddiness, BPPV    Referring Provider (PT) Dr. Malvin Johns    Onset Date/Surgical Date 12/05/19    Hand Dominance Right    Next MD Visit Not reported    Prior Therapy  None for this issue      Precautions   Precautions Fall      Restrictions   Weight Bearing Restrictions No      Balance Screen   Has the patient fallen in the past 6 months No   Multiple stumbles   Has the patient had a decrease in activity level because of a fear of falling?  Yes    Is the patient reluctant to leave their home because of a fear of falling?  No      Home [employer]ist information centre manager residence      Prior Function   Level of Independence Independent    Vocation Full time employment   Currently unable to work   Optometrist    Leisure Weightlifting/bodybuilding      Cognition   Overall Cognitive  Status Within Functional Limits for tasks assessed                  VESTIBULAR AND BALANCE EVALUATION   HISTORY:  Subjective history of current problem: Pt reports that he had a bad spell of severe vertigo with nausea and vomiting about a year ago. He went to the emergency department at [facility] (date in medical record is 12/05/19).  He was given meclizine and zofran. MRI brain was performed which was negative for any acute changes. He was discharged with instructions to follow-up with ENT. Pt states that he was also having double vision when symptoms first started however that has improved. The dizziness has been progressively worse since that time especially over the last 4-5 months. Riding in the car is especially difficult and causes a lot of dizziness. 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 He has been experiencing bilateral tinnitus as well as headaches since the symptoms started. He has a history of migraine headaches as a child but has not had migraines in adulthood. However, he has been having headaches with these symptoms but state that they are not migraines. He is being followed by neurology who has prescribed nortriptyline and diazepam. He doesn't like taking the diazepam because it makes him feel drowsy. He states that the symptoms make him feel fatigued and he sleeps a lot. He was previously very active exercising and bodybuilding at the gym but has mostly stopped due to his symptoms. He reports taking medications for depression due to these symptoms but this has impacted his libido. 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 Unclear if pt had a VNG study but unable to find in the medical record. He does report that he had a hearing test with some hearing loss bilaterally but it was symmetrical.    Description of  dizziness: (vertigo, unsteadiness, lightheadedness, falling, general unsteadiness, whoozy, swimmy-headed sensation, aural fullness) initially vertigo but not currently, unsteadiness, lightheadedness Frequency: "continuous"  Duration: constant Symptom nature: (motion provoked, positional, spontaneous, constant, variable, intermittent) mostly motion provoked  Provocative Factors: turning head in grocery store, straining upper traps, turning head in car Easing Factors: sometimes rest helps but otherwise unknown;  Progression of symptoms: (better, worse, no change since onset): Worse History of similar episodes: None  Falls (yes/no): No, but multiple stumbles  Prior Functional Level: Independent with ADLs/IADLs, working, exercising at the gym up to 6x/wk;   Auditory complaints (tinnitus, pain,  drainage, hearing loss, aural fullness): aural fullness on R side, bilateral symmetrical hearing loss, aching bilateral ears, denies drainage; Vision (diplopia, visual field loss, recent changes, last eye exam): initially diplopia but not currently. Complains of a "blurry ring" in L vision occasionally.  Headaches/migraines: Migraines as a kid, headaches currently 3-4x/wk;  Chest pain/palpitations: None;  Red Flags: (dysarthria, dysphagia, drop attacks, bowel and bladder changes, recent weight loss/gain) Review of systems negative for red flags with the exception of some weight gain related to change in physical activity level;    EXAMINATION  POSTURE: No gross deficits identified  NEUROLOGICAL SCREEN: (2+ unless otherwise noted.) N=normal  Ab=abnormal  Level Dermatome R L Myotome R L Reflex R L  C3 Anterior Neck N N Sidebend C2-3 N N Jaw CN V    C4 Top of Shoulder N N Shoulder Shrug C4 N N Hoffman's UMN    C5 Lateral Upper Arm N N Shoulder ABD C4-5 N N Biceps C5-6    C6 Lateral Arm/ Thumb N N Arm Flex/ Wrist Ext C5-6 N N Brachiorad. C5-6    C7 Middle Finger N N Arm Ext//Wrist Flex C6-7 N N  Triceps C7    C8 4th & 5th Finger N N Flex/ Ext Carpi Ulnaris C8 N N Patellar (L3-4)    T1 Medial Arm N N Interossei T1 N N Gastrocnemius    L2 Medial thigh/groin N N Illiopsoas (L2-3) N N     L3 Lower thigh/med.knee N N Quadriceps (L3-4) N N     L4 Medial leg/lat thigh N N Tibialis Ant (L4-5) N N     L5 Lat. leg & dorsal foot N N EHL (L5) N N     S1 post/lat foot/thigh/leg N N Gastrocnemius (S1-2) N N     S2 Post./med. thigh & leg N N Hamstrings (L4-S3) N N       Cranial Nerves Visual acuity and visual fields are intact  Extraocular muscles are intact  Facial sensation is intact bilaterally  Facial strength is intact bilaterally  Hearing is normal as tested by gross conversation Palate elevates midline, normal phonation  Shoulder shrug strength is intact  Tongue protrudes midline    SOMATOSENSORY:         Sensation           Intact      Diminished         Absent  Light touch Normal      COORDINATION: Deferred  MUSCULOSKELETAL SCREEN: Cervical Spine ROM: WFL and painless in all planes. No gross deficits identified   ROM: WNL  MMT: WNL  Functional Mobility: Independent for transfers and ambulation without assistive device   Gait: Scanning of visual environment with gait: limited spontaneous head turns during gait;   POSTURAL CONTROL TESTS:   Clinical Test of Sensory Interaction for Balance    (CTSIB): Deferred  OCULOMOTOR / VESTIBULAR TESTING:  Oculomotor Exam- Room Light  Findings Comments  Ocular Alignment normal   Ocular ROM normal   Spontaneous Nystagmus normal   Gaze-Holding Nystagmus normal   End-Gaze Nystagmus normal   Vergence (normal 2-3") not examined   Smooth Pursuit normal   Cross-Cover Test not examined   Saccades normal   VOR Cancellation abnormal Pt reports dizziness, no saccades noted  Left Head Impulse normal   Right Head Impulse abnormal Corrective saccades consistently observed  Static Acuity not examined   Dynamic Acuity not examined      Oculomotor Exam- Fixation Suppressed  Findings Comments  Ocular Alignment normal   Spontaneous Nystagmus abnormal Pure horizontal L beating nystagmus, does not fatigue  Gaze-Holding Nystagmus abnormal See above, L horizontal beating nystagmus worsens during L gaze, not visible during R gaze (2nd degree nystagmus).  End-Gaze Nystagmus abnormal See above  Head Shaking Nystagmus abnormal Pure horizontal L beating nystagmus worsens post headshake  Pressure-Induced Nystagmus not examined   Hyperventilation Induced Nystagmus not examined   Skull Vibration Induced Nystagmus not examined     BPPV TESTS:  Symptoms Duration Intensity Nystagmus  L Dix-Hallpike None   None  R Dix-Hallpike None   None  L Head Roll None   None  R Head Roll None   None  L Sidelying Test      R Sidelying Test                Objective measurements completed on examination: See above findings.               PT Education - 12/22/20 1245    Education Details Plan of care, function of inner ear, UVH    Person(s) Educated Patient    Methods Explanation;Handout    Comprehension Verbalized understanding            PT Short Term Goals - 12/22/20 1326      PT SHORT TERM GOAL #1   Title Pt will be independent with HEP in order to improve dizziness in order to decrease fall risk and improve function at home and work.    Time 6    Period Weeks    Status New    Target Date 02/02/21             PT Long Term Goals - 12/22/20 1326      PT LONG TERM GOAL #1   Title Pt will improve ABC by at least 13% in order to demonstrate clinically significant improvement in balance confidence.    Baseline 12/22/20: To complete at next session    Time 12    Period Weeks    Status New    Target Date 03/16/21      PT LONG TERM GOAL #2   Title Pt will decrease DHI score by at least 18 points in order to demonstrate clinically significant reduction in disability    Baseline 12/22/20: To complete at  next session    Time 12    Period Weeks    Status New    Target Date 03/16/21      PT LONG TERM GOAL #3   Title Pt will improve FGA by at least 4 points in order to demonstrate clinically significant improvement in balance and decreased risk for falls.    Baseline 12/22/20: To be completed    Time 12    Period Weeks    Status New    Target Date 03/16/21      PT LONG TERM GOAL #4   Title Pt will improve FOTO to at least 55 in order to demonstrate significant improvement in function related to dizziness    Baseline 12/22/20: 39    Time 12    Period Weeks    Status New    Target Date 03/16/21                  Plan - 12/22/20 1246    Clinical Impression Statement Pt is a pleasant 60 year-old male referred for dizziness. 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  Otherwise no significant findings during examination.  All BPPV testing negative.  Based on patient's history and chart review it does not appear that he was suffering from BPPV but more likely either an acute attack of Mnire's disease or a vestibular neuritis/labyrinthitis when he went to the emergency room in March 2021.  If more objective data is needed patient will require a VNG study with ENT to perform calorics however vestibular rehabilitation will still be the treatment of choice. Additional balance testing will be performed at first follow-up visit in addition to initiation of HEP. Pt will benefit from PT services to address deficits in dizziness in order to return to full function at home.    Personal Factors and Comorbidities Age;Comorbidity 1    Comorbidities HTN    Examination-Activity Limitations Locomotion Level    Examination-Participation Restrictions Community Activity;Occupation    Stability/Clinical Decision Making Unstable/Unpredictable    Clinical Decision Making Low     Rehab Potential Good    PT Frequency 1x / week    PT Duration 12 weeks    PT Treatment/Interventions ADLs/Self Care Home Management;Aquatic Therapy;Canalith Repostioning;Cryotherapy;Electrical Stimulation;Biofeedback;Iontophoresis 4mg /ml Dexamethasone;Moist Heat;Traction;Ultrasound;Gait training;Contrast Bath;Stair training;Functional mobility training;Therapeutic activities;Therapeutic exercise;Balance training;Neuromuscular re-education;Cognitive remediation;Patient/family education;Manual techniques;Passive range of motion;Dry needling;Energy conservation;Vestibular;Spinal Manipulations;Joint Manipulations    PT Next Visit Plan Have pt complete DHI and ABC, perform BERG and FGA, initiate VOR x 1 horizontal and HEP    PT Home Exercise Plan None currently    Consulted and Agree with Plan of Care Patient           Patient will benefit from skilled therapeutic intervention in order to improve the following deficits and impairments:  Dizziness  Visit Diagnosis: Dizziness and giddiness - Plan: PT plan of care cert/re-cert  Unsteadiness on feet - Plan: PT plan of care cert/re-cert     Problem List There are no problems to display for this patient.  Angel Padilla PT, DPT, GCS  Angel Padilla 12/22/2020, 1:42 PM  [facility] [address] [place], [place], [place] Phone: [phone]   Fax:  [phone]  Name: Angel Padilla MRN: Maximiano Coss Date of Birth: Jan 27, 1961

## 2020-12-27 ENCOUNTER — Ambulatory Visit: Payer: BC Managed Care – PPO

## 2020-12-29 ENCOUNTER — Ambulatory Visit: Payer: BC Managed Care – PPO | Attending: Neurology

## 2020-12-29 ENCOUNTER — Other Ambulatory Visit: Payer: Self-pay

## 2020-12-29 DIAGNOSIS — R2681 Unsteadiness on feet: Secondary | ICD-10-CM | POA: Diagnosis present

## 2020-12-29 DIAGNOSIS — R42 Dizziness and giddiness: Secondary | ICD-10-CM | POA: Insufficient documentation

## 2020-12-29 NOTE — Patient Instructions (Addendum)
Access Code: [email or web address] URL: [email or web address] Date: 12/29/2020 Prepared by: Ria Comment  Exercises Seated Gaze Stabilization with Head Rotation - 4 x daily - 7 x weekly - 3 reps - 60 seconds hold

## 2020-12-29 NOTE — Therapy (Signed)
[facility] [address] [place], [place], [address] Phone: [phone]   Fax:  [phone]  Physical Therapy Treatment  Patient Details  Name: Angel CossGary L Padilla MRN: [id number] Date of Birth: 12/10/1960 Referring Provider (PT): Dr. Malvin JohnsPotter   Encounter Date: 12/29/2020   PT End of Session - 12/29/20 0929    Visit Number 2    Number of Visits 13    Date for PT Re-Evaluation 03/16/21    Authorization Type eval: 12/22/20    PT Start Time 0931    PT Stop Time 1016    PT Time Calculation (min) 45 min    Activity Tolerance Patient tolerated treatment well    Behavior During Therapy [facility] for tasks assessed/performed           Past Medical History:  Diagnosis Date  . Hypertension     History reviewed. No pertinent surgical history.  There were no vitals filed for this visit.   Subjective Assessment - 12/29/20 0929    Subjective Pt reports that his dizziness is unchanged since the initial evaluation. He denies any pain upon arrival.    Pertinent History Pt reports that he had a bad spell of severe vertigo with nausea and vomiting about a year ago. [facility] (date in medical record is 12/05/19).  He was given meclizine and zofran. MRI brain was performed which was negative for any acute changes. He was discharged with instructions to follow-up with ENT. Pt states that he was also having double vision when symptoms first started however that has improved. The dizziness has been progressively worse since that time especially over the last 4-5 months. Riding in the car is especially difficult and causes a lot of dizziness. He is still employed as a Forensic scientistmechanic on large industrial equipment but has had to stop working recently due to the symptoms. He has been experiencing bilateral tinnitus as well as headaches since the symptoms started. He has a history of migraine headaches as a child but has not had migraines in  adulthood. However, he has been having headaches with these symptoms but state that they are not migraines. He is being followed by neurology who has prescribed nortriptyline and diazepam. He doesn't like taking the diazepam because it makes him feel drowsy. He states that the symptoms make him feel fatigued and he sleeps a lot. He was previously very active exercising and bodybuilding at the gym but has mostly stopped due to his symptoms. He reports taking medications for depression due to these symptoms but this has impacted his libido. 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 Unclear if pt had a VNG study but unable to find in the medical record. He does report that he had a hearing test with some hearing loss bilaterally but it was symmetrical.    Limitations Walking    Diagnostic tests Brain MRI: WNL    Patient Stated Goals Decrease symptoms, return to weightlifting/bodybuilding    Currently in Pain? No/denies              [facility] PT Assessment - 12/29/20 0941      Standardized Balance Assessment   Standardized Balance Assessment Berg Balance Test;Mini-BESTest      Berg Balance Test   Sit to Stand Able to stand without using hands and stabilize independently    Standing Unsupported  Able to stand safely 2 minutes    Sitting with Back Unsupported but Feet Supported on Floor or Stool Able to sit safely and securely 2 minutes    Stand to Sit Sits safely with minimal use of hands    Transfers Able to transfer safely, minor use of hands    Standing Unsupported with Eyes Closed Able to stand 10 seconds safely    Standing Unsupported with Feet Together Able to place feet together independently and stand 1 minute safely    From Standing, Reach Forward with Outstretched Arm Can reach confidently >25 cm (10")    From Standing Position, Pick up Object from Floor Able to pick up shoe safely and easily     From Standing Position, Turn to Look Behind Over each Shoulder Looks behind from both sides and weight shifts well    Turn 360 Degrees Able to turn 360 degrees safely in 4 seconds or less    Standing Unsupported, Alternately Place Feet on Step/Stool Able to stand independently and safely and complete 8 steps in 20 seconds    Standing Unsupported, One Foot in Front Able to place foot tandem independently and hold 30 seconds    Standing on One Leg Able to lift leg independently and hold 5-10 seconds    Total Score 55      Functional Gait  Assessment   Gait assessed  Yes    Gait Level Surface Walks 20 ft in less than 5.5 sec, no assistive devices, good speed, no evidence for imbalance, normal gait pattern, deviates no more than 6 in outside of the 12 in walkway width.    Change in Gait Speed Able to smoothly change walking speed without loss of balance or gait deviation. Deviate no more than 6 in outside of the 12 in walkway width.    Gait with Horizontal Head Turns Performs head turns smoothly with slight change in gait velocity (eg, minor disruption to smooth gait path), deviates 6-10 in outside 12 in walkway width, or uses an assistive device.    Gait with Vertical Head Turns Performs task with slight change in gait velocity (eg, minor disruption to smooth gait path), deviates 6 - 10 in outside 12 in walkway width or uses assistive device    Gait and Pivot Turn Pivot turns safely within 3 sec and stops quickly with no loss of balance.    Step Over Obstacle Is able to step over 2 stacked shoe boxes taped together (9 in total height) without changing gait speed. No evidence of imbalance.    Gait with Narrow Base of Support Ambulates 4-7 steps.    Gait with Eyes Closed Walks 20 ft, slow speed, abnormal gait pattern, evidence for imbalance, deviates 10-15 in outside 12 in walkway width. Requires more than 9 sec to ambulate 20 ft.    Ambulating Backwards Walks 20 ft, no assistive devices, good speed, no  evidence for imbalance, normal gait    Steps Alternating feet, must use rail.    Total Score 23              TREATMENT   Neuromuscular Re-education  Pt completed ABC (38.125%) and DHI (84/100) Performed additional outcome measures with patient: BERG: 55/56 FGA: 23/30 MCTSIB: 30s in conditions 1-3 with 2+ sway in conditions 2 and 3, 3s during condition 4 before falling VOR x 1 horizontal in sitting 60s x 3, 7/10 severity after first rep, 6/10 severity after second and third reps; HEP issued and extensive education  provided to patient;    Pt completed ABC and DHI indicating low balance confidence and high reported disability respectively. Performed additional outcome measures with patient and he scored 55/56 on the BERG and 23/30 on the FGA. During the mCTSIB he is only able to balance for around 3s in condition 4. Initiated VOR x 1 horizontal in sitting which provokes significant dizziness for patient at relatively slow speed. Will continue to progress with gaze stabilization as well as habituation exercises in upcoming appointments. Pt encouraged to starte HEP. Pt will benefit from PT services to address deficits in dizziness and balance in order to return to full function at home.                    PT Education - 12/29/20 1131    Education Details HEP    Person(s) Educated Patient    Methods Explanation    Comprehension Verbalized understanding            PT Short Term Goals - 12/22/20 1326      PT SHORT TERM GOAL #1   Title Pt will be independent with HEP in order to improve dizziness in order to decrease fall risk and improve function at home and work.    Time 6    Period Weeks    Status New    Target Date 02/02/21             PT Long Term Goals - 12/29/20 1135      PT LONG TERM GOAL #1   Title Pt will improve ABC by at least 13% in order to demonstrate clinically significant improvement in balance confidence.    Baseline 12/22/20: To complete  at next session; 12/29/20: 38.125%    Time 12    Period Weeks    Status New    Target Date 03/16/21      PT LONG TERM GOAL #2   Title Pt will decrease DHI score by at least 18 points in order to demonstrate clinically significant reduction in disability    Baseline 12/22/20: To complete at next session; 12/29/20: 84/100    Time 12    Period Weeks    Status New    Target Date 03/16/21      PT LONG TERM GOAL #3   Title Pt will improve FGA by at least 4 points in order to demonstrate clinically significant improvement in balance and decreased risk for falls.    Baseline 12/22/20: To be completed; 12/29/20: 23/30    Time 12    Period Weeks    Status New    Target Date 03/16/21      PT LONG TERM GOAL #4   Title Pt will improve FOTO to at least 55 in order to demonstrate significant improvement in function related to dizziness    Baseline 12/22/20: 39    Time 12    Period Weeks    Status New    Target Date 03/16/21                 Plan - 12/29/20 0930    Clinical Impression Statement Pt completed ABC and DHI indicating low balance confidence and high reported disability respectively. Performed additional outcome measures with patient and he scored 55/56 on the BERG and 23/30 on the FGA. During the mCTSIB he is only able to balance for around 3s in condition 4. Initiated VOR x 1 horizontal in sitting which provokes significant dizziness for patient at relatively slow speed.  Will continue to progress with gaze stabilization as well as habituation exercises in upcoming appointments. Pt encouraged to starte HEP. Pt will benefit from PT services to address deficits in dizziness and balance in order to return to full function at home.    Personal Factors and Comorbidities Age;Comorbidity 1    Comorbidities HTN    Examination-Activity Limitations Locomotion Level    Examination-Participation Restrictions Community Activity;Occupation    Stability/Clinical Decision Making Unstable/Unpredictable     Rehab Potential Good    PT Frequency 1x / week    PT Duration 12 weeks    PT Treatment/Interventions ADLs/Self Care Home Management;Aquatic Therapy;Canalith Repostioning;Cryotherapy;Electrical Stimulation;Biofeedback;Iontophoresis 4mg /ml Dexamethasone;Moist Heat;Traction;Ultrasound;Gait training;Contrast Bath;Stair training;Functional mobility training;Therapeutic activities;Therapeutic exercise;Balance training;Neuromuscular re-education;Cognitive remediation;Patient/family education;Manual techniques;Passive range of motion;Dry needling;Energy conservation;Vestibular;Spinal Manipulations;Joint Manipulations    PT Next Visit Plan Have pt complete DHI and ABC, perform BERG and FGA, initiate VOR x 1 horizontal and HEP    PT Home Exercise Plan Access Code: [email or web address]    Consulted and Agree with Plan of Care Patient           Patient will benefit from skilled therapeutic intervention in order to improve the following deficits and impairments:  Dizziness  Visit Diagnosis: Dizziness and giddiness  Unsteadiness on feet     Problem List There are no problems to display for this patient.  Ezekiah Massie PT, DPT, GCS  Monzerrath Mcburney 12/29/2020, 11:38 AM  [facility] [address] [place], [place], [place] Phone: [phone]   Fax:  [phone]  Name: JERED HEINY MRN: Angel Padilla Date of Birth: 06/13/1961

## 2021-01-03 NOTE — Patient Instructions (Incomplete)
TREATMENT   Neuromuscular Re-education  VOR x 1 horizontal in sitting 60s x 3, 7/10 severity after first rep, 6/10 severity after second and third reps; Progress habituation exercises HEP issued and extensive education provided to patient;    Continued VOR x 1 horizontal in sitting which provokes significant dizziness for patient at relatively slow speed. Will continue to progress with gaze stabilization as well as habituation exercises in upcoming appointments. Pt encouraged to starte HEP. Pt will benefit from PT services to address deficits in dizziness and balance in order to return to full function at home.

## 2021-01-05 ENCOUNTER — Ambulatory Visit: Payer: BC Managed Care – PPO

## 2021-01-05 DIAGNOSIS — R2681 Unsteadiness on feet: Secondary | ICD-10-CM

## 2021-01-05 DIAGNOSIS — R42 Dizziness and giddiness: Secondary | ICD-10-CM

## 2021-01-09 ENCOUNTER — Other Ambulatory Visit: Payer: Self-pay | Admitting: Neurology

## 2021-01-09 DIAGNOSIS — I729 Aneurysm of unspecified site: Secondary | ICD-10-CM

## 2021-01-10 ENCOUNTER — Ambulatory Visit
Admission: RE | Admit: 2021-01-10 | Discharge: 2021-01-10 | Disposition: A | Discharge status: Home / Self Care | Payer: BC Managed Care – PPO | Source: Ambulatory Visit | Attending: Neurology | Admitting: Neurology

## 2021-01-10 ENCOUNTER — Other Ambulatory Visit: Payer: Self-pay

## 2021-01-10 DIAGNOSIS — I729 Aneurysm of unspecified site: Secondary | ICD-10-CM | POA: Diagnosis not present

## 2021-01-10 IMAGING — MR MR MRA HEAD W/O CM
1 series · 20 of 48 positions shown · non-contrast
Comparison: None.

CLINICAL DATA: Chronic dizziness, bilateral tinnitus

EXAM:
MRA HEAD WITHOUT CONTRAST
TECHNIQUE: Angiographic images of the Circle of Willis were obtained using MRA
technique without intravenous contrast.

[Series 5: TOF · axial · 0.5mm · 0.41mm/px · z∈[-64,+34]mm · 20 of 205 slices shown]
[im 1/205]
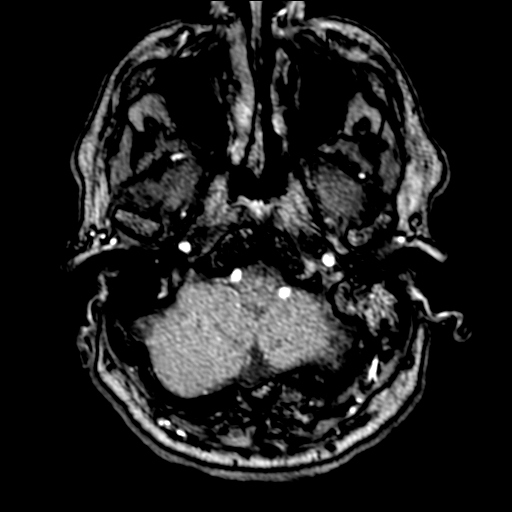
[im 5/205]
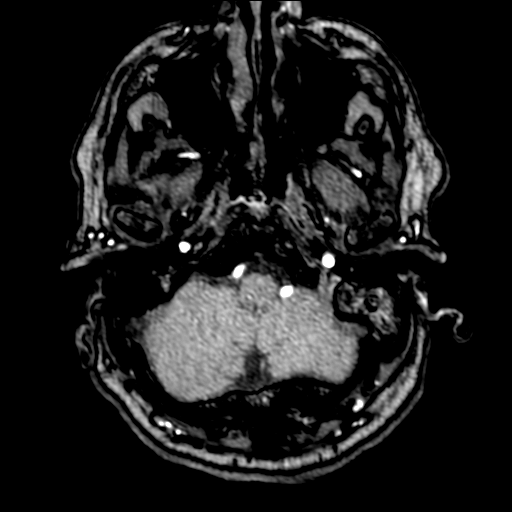
[im 9/205]
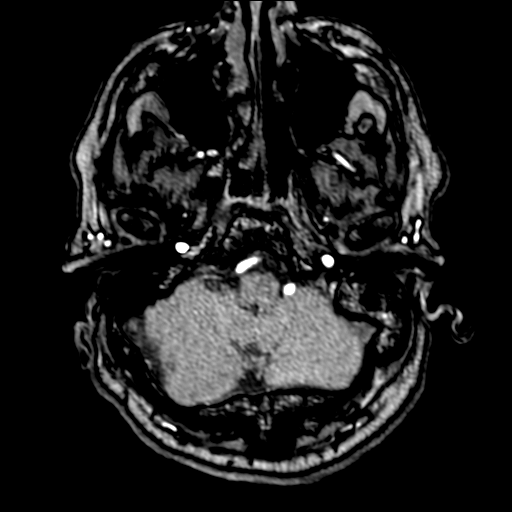
[im 14/205]
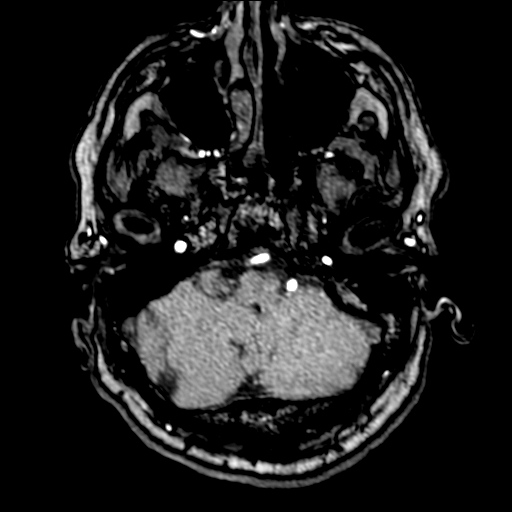
[im 18/205]
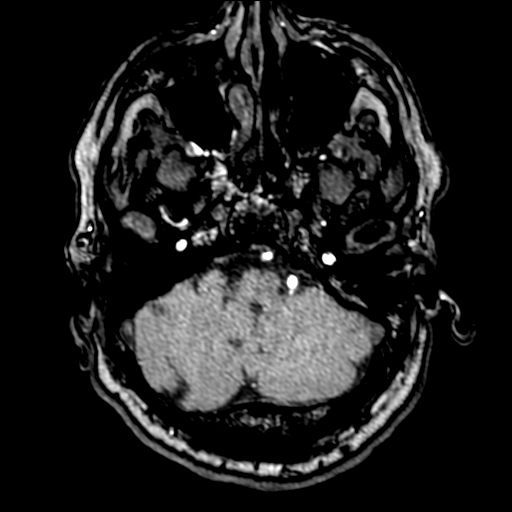
[im 22/205]
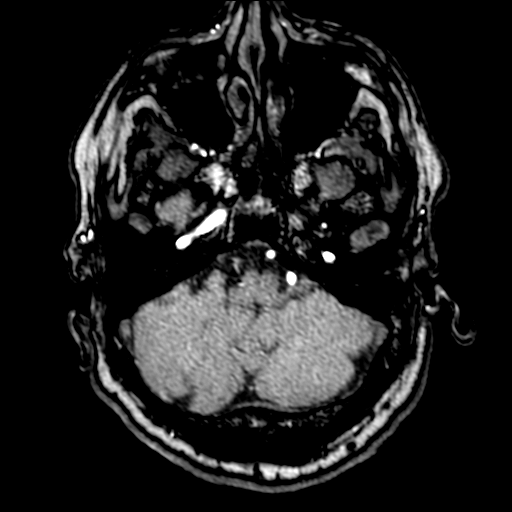
[im 27/205]
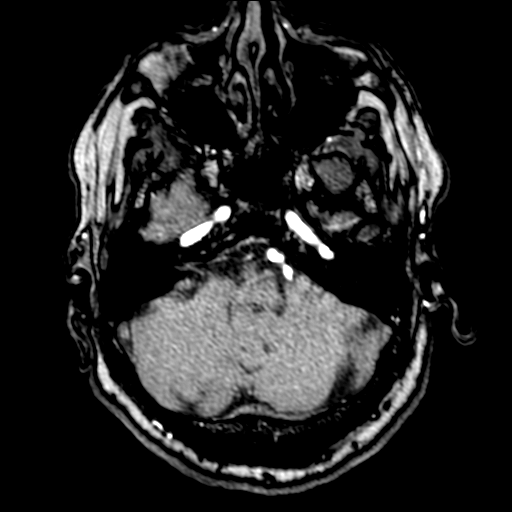
[im 31/205]
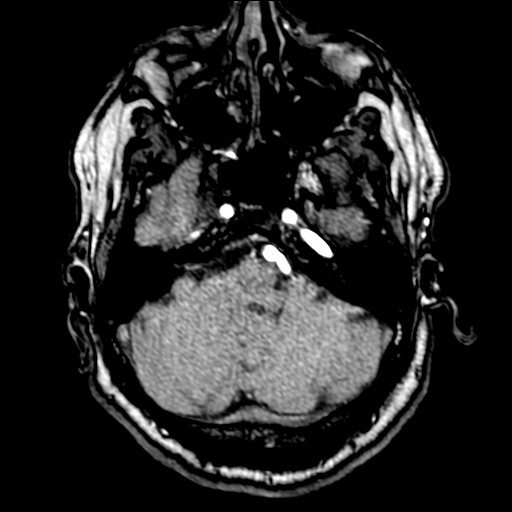
[im 35/205]
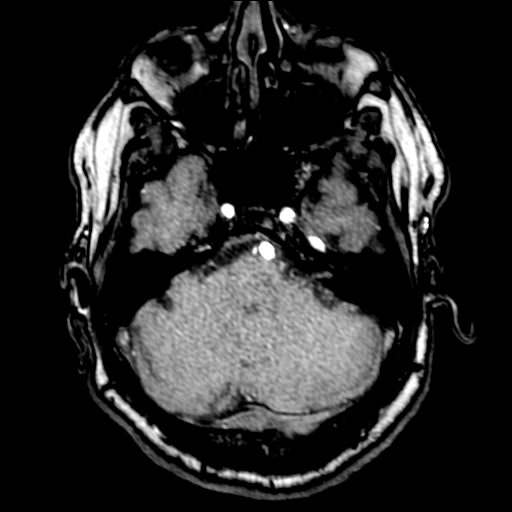
[im 40/205]
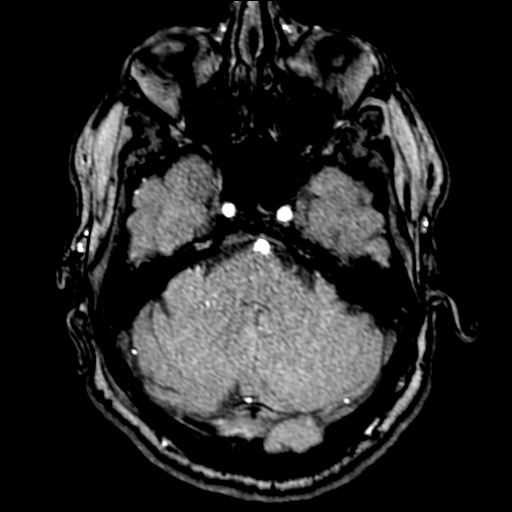
[im 44/205]
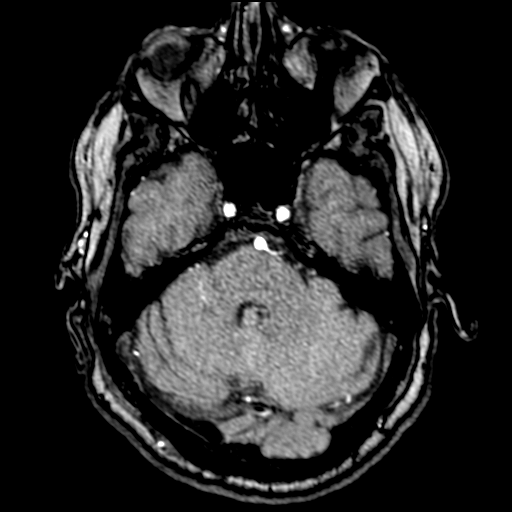
[im 48/205]
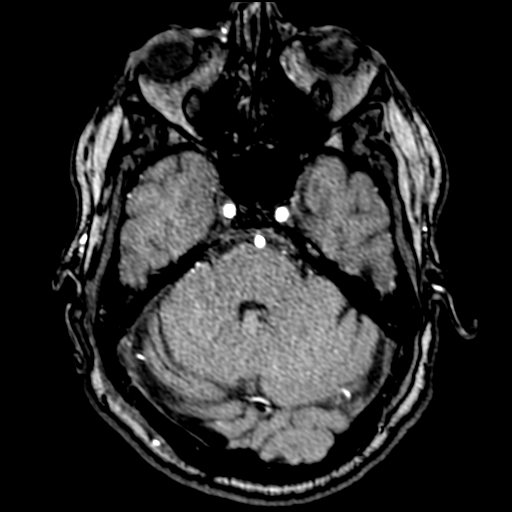
[im 66/205]
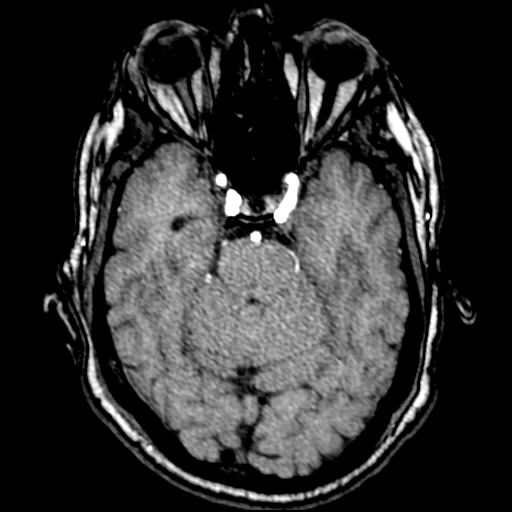
[im 92/205]
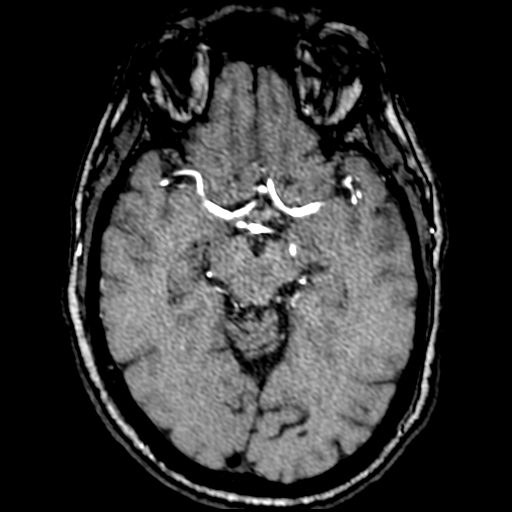
[im 105/205]
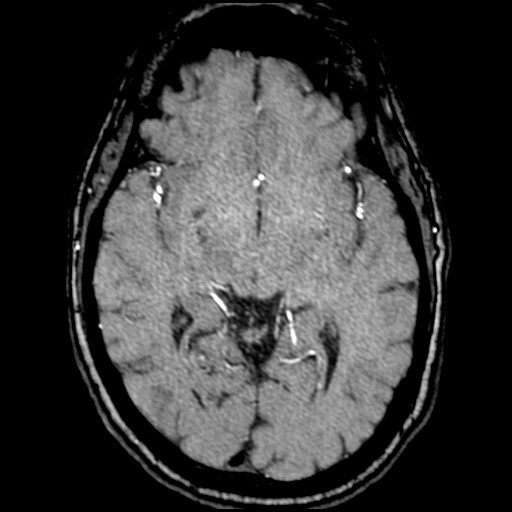
[im 118/205]
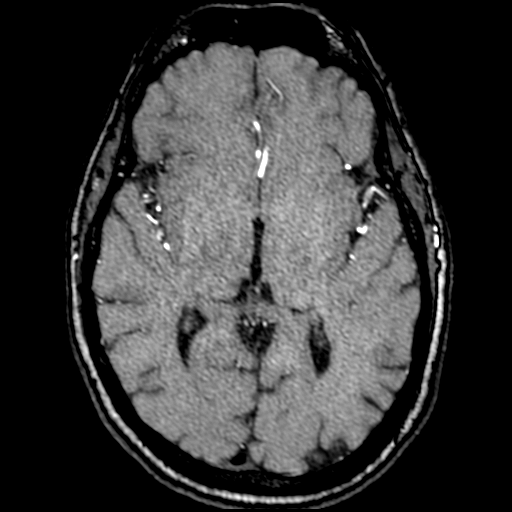
[im 144/205]
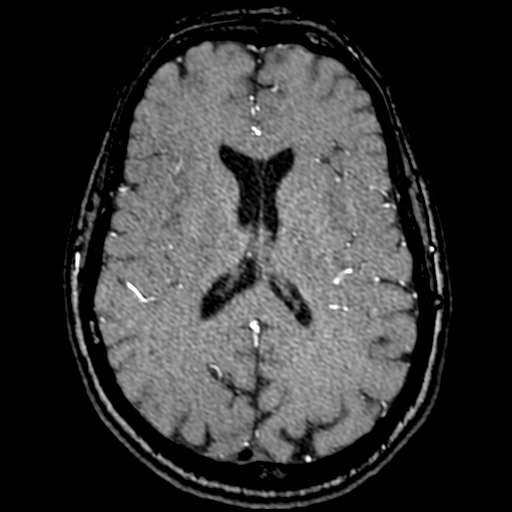
[im 170/205]
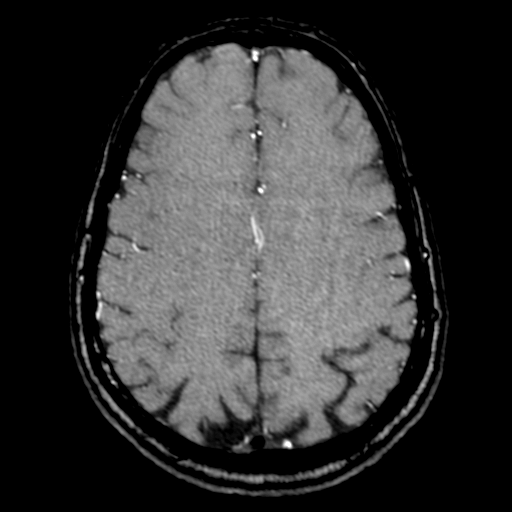
[im 174/205]
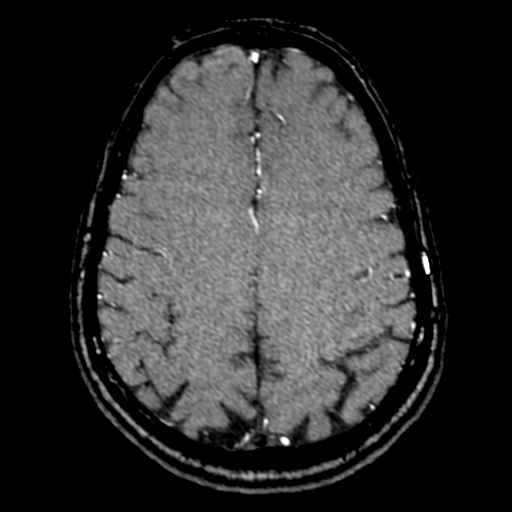
[im 196/205]
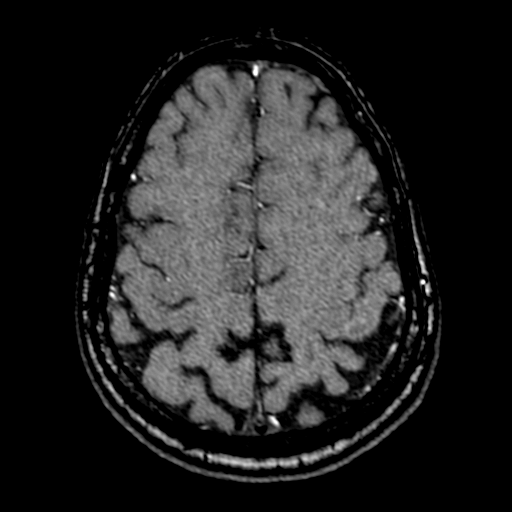

[20 of 48 positions shown; findings below may reference images not displayed]

FINDINGS: Intracranial internal carotid arteries are patent. Middle and
anterior cerebral arteries are patent. Included intracranial
vertebral arteries are patent. Basilar artery is patent. Posterior
cerebral arteries are patent. There is no significant stenosis or
aneurysm.
IMPRESSION: Normal MRA of the head.

## 2021-01-12 ENCOUNTER — Ambulatory Visit: Payer: BC Managed Care – PPO

## 2021-01-12 NOTE — Patient Instructions (Incomplete)
TREATMENT   Neuromuscular Re-education  VOR x 1 horizontal in sitting 60s x 3, 7/10 severity after first rep, 6/10 severity after second and third reps;    Pt completed ABC and DHI indicating low balance confidence and high reported disability respectively. Performed additional outcome measures with patient and he scored 55/56 on the BERG and 23/30 on the FGA. During the mCTSIB he is only able to balance for around 3s in condition 4. Initiated VOR x 1 horizontal in sitting which provokes significant dizziness for patient at relatively slow speed. Will continue to progress with gaze stabilization as well as habituation exercises in upcoming appointments. Pt encouraged to starte HEP. Pt will benefit from PT services to address deficits in dizziness and balance in order to return to full function at home.

## 2021-01-19 ENCOUNTER — Ambulatory Visit: Payer: BC Managed Care – PPO

## 2021-01-19 DIAGNOSIS — R42 Dizziness and giddiness: Secondary | ICD-10-CM

## 2021-01-19 DIAGNOSIS — R2681 Unsteadiness on feet: Secondary | ICD-10-CM

## 2021-01-19 NOTE — Patient Instructions (Incomplete)
TREATMENT   Neuromuscular Re-education  VOR x 1 horizontal in sitting 60s x 3, 7/10 severity after first rep, 6/10 severity after second and third reps; HEP issued and extensive education provided to patient;    Pt completed ABC and DHI indicating low balance confidence and high reported disability respectively. Performed additional outcome measures with patient and he scored 55/56 on the BERG and 23/30 on the FGA. During the mCTSIB he is only able to balance for around 3s in condition 4. Initiated VOR x 1 horizontal in sitting which provokes significant dizziness for patient at relatively slow speed. Will continue to progress with gaze stabilization as well as habituation exercises in upcoming appointments. Pt encouraged to starte HEP. Pt will benefit from PT services to address deficits in dizziness and balance in order to return to full function at home.    

## 2021-01-25 NOTE — Patient Instructions (Incomplete)
TREATMENT   Neuromuscular Re-education  VOR x 1 horizontal in sitting 60s x 3, 7/10 severity after first rep, 6/10 severity after second and third reps; HEP issued and extensive education provided to patient;    Pt completed ABC and DHI indicating low balance confidence and high reported disability respectively. Performed additional outcome measures with patient and he scored 55/56 on the BERG and 23/30 on the FGA. During the mCTSIB he is only able to balance for around 3s in condition 4. Initiated VOR x 1 horizontal in sitting which provokes significant dizziness for patient at relatively slow speed. Will continue to progress with gaze stabilization as well as habituation exercises in upcoming appointments. Pt encouraged to starte HEP. Pt will benefit from PT services to address deficits in dizziness and balance in order to return to full function at home.

## 2021-01-26 ENCOUNTER — Ambulatory Visit: Payer: BC Managed Care – PPO | Attending: Neurology

## 2021-01-26 DIAGNOSIS — R2681 Unsteadiness on feet: Secondary | ICD-10-CM

## 2021-01-26 DIAGNOSIS — R42 Dizziness and giddiness: Secondary | ICD-10-CM

## 2021-11-20 ENCOUNTER — Telehealth: Payer: Self-pay

## 2021-11-20 NOTE — Telephone Encounter (Signed)
CALLED PATIENT NO ANSWER LEFT VOICEMAIL FOR A CALL BACK ? ?

## 2021-11-21 ENCOUNTER — Telehealth: Payer: Self-pay

## 2021-11-21 NOTE — Telephone Encounter (Signed)
CALLED PATIENT NO ANSWER LEFT VOICEMAIL FOR A CALL BACK ? ?

## 2021-11-21 NOTE — Telephone Encounter (Signed)
CALLED PATIENT NO ANSWER LEFT VOICEMAIL FOR A CALL BACK °Letter sent °

## 2022-05-07 ENCOUNTER — Ambulatory Visit: Payer: Self-pay | Admitting: Dermatology

## 2023-06-09 ENCOUNTER — Emergency Department
Admission: EM | Admit: 2023-06-09 | Discharge: 2023-06-09 | Disposition: A | Discharge status: Home / Self Care | Payer: BLUE CROSS/BLUE SHIELD | Attending: Emergency Medicine | Admitting: Emergency Medicine

## 2023-06-09 ENCOUNTER — Encounter: Payer: Self-pay | Admitting: Emergency Medicine

## 2023-06-09 ENCOUNTER — Other Ambulatory Visit: Payer: Self-pay

## 2023-06-09 DIAGNOSIS — H9313 Tinnitus, bilateral: Secondary | ICD-10-CM | POA: Insufficient documentation

## 2023-06-09 DIAGNOSIS — I10 Essential (primary) hypertension: Secondary | ICD-10-CM | POA: Insufficient documentation

## 2023-06-09 MED ORDER — CYCLOBENZAPRINE HCL 5 MG PO TABS: 5.0000 mg | ORAL_TABLET | Freq: Three times a day (TID) | ORAL | 0 refills | Status: AC | PRN

## 2023-06-09 NOTE — ED Provider Notes (Signed)
[facility] Provider Note    Event Date/Time   First MD Initiated Contact with Patient 06/09/23 1258     (approximate)   History   Tinnitus   HPI  Angel Padilla is a 62 y.o. male with PMH of HTN, tinnitus and vertigo presents for evaluation of ongoing tinnitus, vertigo and new head pressure.  Patient states that all of his symptoms began about 3 years ago after he was working out.  He has had significant workup for this by both neurology and ENT.  He was told that he is has some hearing loss and nerve damage in his ears.  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  Patient states that recently he has had increased pressure in his head and describes it as wrapping around the back of his head and into his neck.  He states it is not headache and it is not pain but pressure.  He says he is also dizzy but this is only with movement.  The tinnitus is worse with movement as well.  Patient has tried taking gabapentin and meclizine.  He does not feel the gabapentin has been helpful in terms of pain but the meclizine helps a little bit with his dizziness.      Physical Exam   Triage Vital Signs: ED Triage Vitals  Encounter Vitals Group     BP 06/09/23 1155 (!) 170/109     Systolic BP Percentile --      Diastolic BP Percentile --      Pulse Rate 06/09/23 1155 64     Resp 06/09/23 1155 17     Temp 06/09/23 1155 97.9 F (36.6 C)     Temp Source 06/09/23 1155 Oral     SpO2 06/09/23 1155 100 %     Weight 06/09/23 1156 198 lb (89.8 kg)     Height 06/09/23 1156 5\' 8"  (1.727 m)     Head Circumference --      Peak Flow --      Pain Score 06/09/23 1156 8     Pain Loc --      Pain Education --      Exclude from Growth Chart --     Most recent vital signs: Vitals:   06/09/23 1155 06/09/23 1501  BP: (!) 170/109 (!) 147/79  Pulse: 64 71  Resp: 17 17  Temp: 97.9 F (36.6 C) 98.2 F (36.8 C)  SpO2: 100% 100%   General: Awake, no distress.  CV:  Good  peripheral perfusion.  RRR.  No carotid bruits. Resp:  Normal effort.  CTAB. Abd:  No distention.  Other:  No tenderness to palpation over the scalp or neck, ROM of neck maintained but does elicit dizziness and tinnitus, cranial nerves II through XII intact, patient able to walk, heel walk, toe walk but does have difficulty with heel-toe walking.   ED Results / Procedures / Treatments   Labs (all labs ordered are listed, but only abnormal results are displayed) Labs Reviewed - No data to display   PROCEDURES:  Critical Care performed: No  Procedures   MEDICATIONS ORDERED IN ED: Medications - No data to display   IMPRESSION / MDM / ASSESSMENT AND PLAN / ED COURSE  I reviewed the triage vital signs and the nursing notes.                             62 year old male  presents for evaluation of tinnitus and vertigo with history of the same.  Patient was hypertensive in triage otherwise vital signs stable.  Patient NAD on exam.  Differential diagnosis includes, but is not limited to, vertigo, medication side effect, trigeminal neuralgia, giant cell arteritis, vertebral artery dissection, cervical muscle strain, cerebellar stroke.  Patient's presentation is most consistent with acute, uncomplicated illness.  Patient with history of tinnitus and vertigo presented to the ED as she has been unable to have resolution of his symptoms over the past 3 years.  He has had significant workup by neurology and ENT.  He completed vestibular therapy and has had multiple imaging studies.  Patient's most recent study was an MRA in 2022 which was normal.  I spoke with my supervising physician, Dr. Arnoldo Morale, who also saw the patient.  She is in agreement with me that there is likely not anything acute going on.  I did offer patient a CTA as he has not had this done before.  Patient declined and would prefer to have this done outpatient.  Given that his symptoms are chronic and he has not had an acute  worsening of them I feel he stable for outpatient management.  I will send a prescription for Flexeril to see if this helps address patient's neck pain and head pressure.  I will give him follow-up with neurology so he can get a second opinion.  He also was advised to reach out to ENT to restart vestibular therapy.  Patient was agreeable to plan, voiced understanding and all questions were answered.  Patient was stable at discharge.    FINAL CLINICAL IMPRESSION(S) / ED DIAGNOSES   Final diagnoses:  Tinnitus of both ears     Rx / DC Orders   ED Discharge Orders          Ordered    cyclobenzaprine (FLEXERIL) 5 MG tablet  3 times daily PRN        06/09/23 1456             Note:  This document was prepared using Dragon voice recognition software and may include unintentional dictation errors.   Cameron Ali, PA-C 06/09/23 1504    Corena Herter, MD 06/09/23 [phone]

## 2023-06-09 NOTE — ED Provider Notes (Signed)
Shared visit  Ongoing tinnitus and headache.  On chart review I do not see that the patient had a CTA head and neck.  Did have symptoms that started immediately after weightlifting.  Discussed with the patient that the low likelihood that there would be a undiagnosed dissection at this time but did offer a CTA for further evaluation.  Discussed that he needed to follow-up closely with his neurologist and reestablish care.  Findings are not consistent with subarachnoid hemorrhage.  No sudden onset and no thunderclap headache.  Have a low suspicion for cerebral venous thrombosis.  Clinical picture is not consistent with meningitis.  No jaw claudication or change in vision, clinical picture is not consistent with giant cell arteritis.  Given return precautions for any worsening symptoms.   Corena Herter, MD 06/09/23 [phone]

## 2023-06-09 NOTE — ED Triage Notes (Signed)
Pt sts that he has been dx with tinnitus and since than he has been starting to have head pressure. Pt sts that she has taken Antivert for dizziness but that is not helping either.

## 2023-06-09 NOTE — Discharge Instructions (Addendum)
Please reach out to Dr. Sherryll Burger, who is the neurologist and schedule an appointment for a second opinion.  Dr. Jenne Campus is the ENT doctor, you can call his office to discuss restarting vestibular therapy.  I have sent Flexeril to your pharmacy which is a muscle relaxer.  You can take this to see if it helps with your neck stiffness.  You can return to the ED with any new or worsening symptoms.

## 2023-06-09 NOTE — ED Notes (Signed)
See triage note Presents with "pressure in head" HX of tinnitus  B/P is up slightly

## 2023-07-28 ENCOUNTER — Emergency Department
Admission: EM | Admit: 2023-07-28 | Discharge: 2023-07-28 | Disposition: A | Discharge status: Home / Self Care | Payer: BLUE CROSS/BLUE SHIELD | Attending: Emergency Medicine | Admitting: Emergency Medicine

## 2023-07-28 ENCOUNTER — Other Ambulatory Visit: Payer: Self-pay

## 2023-07-28 DIAGNOSIS — I10 Essential (primary) hypertension: Secondary | ICD-10-CM | POA: Insufficient documentation

## 2023-07-28 DIAGNOSIS — R42 Dizziness and giddiness: Secondary | ICD-10-CM | POA: Insufficient documentation

## 2023-07-28 LAB — BASIC METABOLIC PANEL
Anion gap: 9 (ref 5–15)
BUN: 16 mg/dL (ref 8–23)
CO2: 24 mmol/L (ref 22–32)
Calcium: 9.5 mg/dL (ref 8.9–10.3)
Chloride: 106 mmol/L (ref 98–111)
Creatinine, Ser: 1.06 mg/dL (ref 0.61–1.24)
GFR, Estimated: >60 mL/min (ref >60)
Glucose, Bld: 146 mg/dL — ABNORMAL HIGH (ref 70–99)
Potassium: 3.7 mmol/L (ref 3.5–5.1)
Sodium: 139 mmol/L (ref 135–145)

## 2023-07-28 LAB — CBC
HCT: 43.5 % (ref 39.0–52.0)
Hemoglobin: 15 g/dL (ref 13.0–17.0)
MCH: 34 pg (ref 26.0–34.0)
MCHC: 34.5 g/dL (ref 30.0–36.0)
MCV: 98.6 fL (ref 80.0–100.0)
Platelets: 263 10*3/uL (ref 150–400)
RBC: 4.41 MIL/uL (ref 4.22–5.81)
RDW: 13.2 % (ref 11.5–15.5)
WBC: 5.2 10*3/uL (ref 4.0–10.5)
nRBC: 0 % (ref 0.0–0.2)

## 2023-07-28 MED ORDER — GABAPENTIN 300 MG PO CAPS: 300.0000 mg | ORAL_CAPSULE | Freq: Three times a day (TID) | ORAL | 2 refills | Status: AC

## 2023-07-28 MED ORDER — CLONAZEPAM 0.25 MG PO TBDP: 0.2500 mg | ORAL_TABLET | Freq: Two times a day (BID) | ORAL | 0 refills | Status: AC

## 2023-07-28 NOTE — Discharge Instructions (Addendum)
Please follow-up with your primary care doctor soon as possible, follow-up with neurology as soon as possible as well.  Please take your medications as needed but only as prescribed.  Do not take Klonopin if you are driving or drinking alcohol.  Please follow-up with your doctor for ongoing medical care as we will not be able to refill any further medications from the emergency department.  Return to the emergency department for any weakness, numbness of any arm or leg confusion slurred speech or any other symptom personally concerning to yourself.

## 2023-07-28 NOTE — ED Provider Notes (Signed)
[facility] Provider Note    Event Date/Time   First MD Initiated Contact with Patient 07/28/23 1105     (approximate)  History   Chief Complaint: Dizziness  HPI  Angel Padilla is a 62 y.o. male with a past medical history of hypertension, vertigo, presents the emergency department for dizziness.  According to the patient for the past 3 to 4 years he has been experiencing persistent dizziness.  He has been seen multiple times for the same.  He was following up with his doctor regarding this he is also seen neurology as well as ENT.  I reviewed the patient's chart he is also had multiple brain imaging including MRIs that have been negative.  Patient states he was placed on a combination of Klonopin 0.25 mg twice daily as well as gabapentin 300 mg 3 times daily which has been controlling his symptoms in addition to use of meclizine at home.  However patient's PCP recently left the practice and he is waiting until 11/18 when he can see the new PCP.  He also tried to get a visit with his neurologist but states they are no longer accepting his insurance.  He states he is out of his medications and he has nowhere else to go so he came to the emergency department for evaluation.  Physical Exam   Triage Vital Signs: ED Triage Vitals  Encounter Vitals Group     BP 07/28/23 1033 (!) 146/104     Systolic BP Percentile --      Diastolic BP Percentile --      Pulse Rate 07/28/23 1033 (!) 55     Resp 07/28/23 1033 17     Temp 07/28/23 1033 98.1 F (36.7 C)     Temp Source 07/28/23 1033 Oral     SpO2 07/28/23 1033 99 %     Weight 07/28/23 1031 198 lb (89.8 kg)     Height 07/28/23 1031 5\' 8"  (1.727 m)     Head Circumference --      Peak Flow --      Pain Score 07/28/23 1030 5     Pain Loc --      Pain Education --      Exclude from Growth Chart --     Most recent vital signs: Vitals:   07/28/23 1033  BP: (!) 146/104  Pulse: (!) 55  Resp: 17  Temp: 98.1 F (36.7  C)  SpO2: 99%    General: Awake, no distress.  CV:  Good peripheral perfusion.  Regular rate and rhythm  Resp:  Normal effort.  Equal breath sounds bilaterally.  Abd:  No distention.  Soft, nontender.  No rebound or guarding.  ED Results / Procedures / Treatments   EKG  EKG viewed and interpreted by myself shows what appears to be a sinus rhythm at 59 bpm with a narrow QRS, normal axis, normal intervals, no concerning ST changes.  MEDICATIONS ORDERED IN ED: Medications - No data to display   IMPRESSION / MDM / ASSESSMENT AND PLAN / ED COURSE  I reviewed the triage vital signs and the nursing notes.  Patient's presentation is most consistent with acute presentation with potential threat to life or bodily function.  Patient presents to the emergency department for continued dizziness/vertigo symptoms that have been ongoing for 4 years but the patient is now off of his medications that were controlling his symptoms and does not have a new PCP visit until 11/18.  Patient's  description of the symptoms sound suggestive for possible BPPV versus Mnire's.  Given the patient's negative MRIs as well as ENT and neurology follow-up I believe the patient would be best suited for outpatient follow-up with neurology.  However as the patient is currently waiting to see his PCP to get a new referral back to neurology we will write the patient a short course of gabapentin as well as Klonopin.  Given the patient's reassuring physical exam vital signs are reassuring lab work with a normal CBC and chemistry I do not believe the patient requires additional imaging as this is a well-documented history with multiple negative brain images in the past.  He denies any new or concerning symptoms.  FINAL CLINICAL IMPRESSION(S) / ED DIAGNOSES   Vertigo Dizziness  Rx / DC Orders   Gabapentin Klonopin  Note:  This document was prepared using Dragon voice recognition software and may include unintentional  dictation errors.   Minna Antis, MD 07/28/23 1123

## 2023-07-28 NOTE — ED Triage Notes (Signed)
Pt states coming in with vertigo and dizziness. Pt states he has had this for "years" and this episode has been going on for "days". Pt states he was offered gabapentin, but that he did not take it at the time. PT denies no falls in the last year. Pt states vertigo since 2020 PCP is currently out of office 08/12/23

## 2023-09-30 ENCOUNTER — Encounter: Payer: Self-pay | Admitting: Family Medicine

## 2023-09-30 ENCOUNTER — Ambulatory Visit: Payer: BLUE CROSS/BLUE SHIELD | Admitting: Family Medicine

## 2023-09-30 VITALS — BP 169/105 | HR 49 | Resp 18 | Ht 68.0 in | Wt 207.0 lb

## 2023-09-30 DIAGNOSIS — I159 Secondary hypertension, unspecified: Secondary | ICD-10-CM

## 2023-09-30 DIAGNOSIS — R42 Dizziness and giddiness: Secondary | ICD-10-CM | POA: Diagnosis not present

## 2023-09-30 DIAGNOSIS — I10 Essential (primary) hypertension: Secondary | ICD-10-CM | POA: Insufficient documentation

## 2023-09-30 DIAGNOSIS — F322 Major depressive disorder, single episode, severe without psychotic features: Secondary | ICD-10-CM

## 2023-09-30 DIAGNOSIS — F32A Depression, unspecified: Secondary | ICD-10-CM | POA: Insufficient documentation

## 2023-09-30 DIAGNOSIS — J189 Pneumonia, unspecified organism: Secondary | ICD-10-CM | POA: Diagnosis not present

## 2023-09-30 MED ORDER — VENLAFAXINE HCL ER 75 MG PO CP24: 75.0000 mg | ORAL_CAPSULE | Freq: Every day | ORAL | 3 refills | Status: DC

## 2023-09-30 MED ORDER — AMLODIPINE BESYLATE 10 MG PO TABS: 10.0000 mg | ORAL_TABLET | Freq: Every day | ORAL | 1 refills | Status: DC

## 2023-09-30 MED ORDER — CLONIDINE HCL 0.1 MG PO TABS
0.1000 mg | ORAL_TABLET | Freq: Once | ORAL | Status: AC
  Administered 2023-09-30: 0.1 mg via ORAL

## 2023-09-30 NOTE — Progress Notes (Signed)
 Established Patient Office Visit  Subjective   Patient ID: Angel Padilla, male    DOB: 05/26/1961  Age: 63 y.o. MRN: [id number]  Chief Complaint  Patient presents with   Medical Management of Chronic Issues    HPI Reviewed review of chart shows he went to the emergency room 09/24/2023 with a slight cough no fever and aching all over.  He was complaining of shortness of breath.  Had a chest x-ray that showed right lower lobe and right middle lobe opacities.  Was started on doxycycline for presumed pneumonia.  States his breathing is a lot better since he started the doxycycline still has several days to take this.  He is hypertensive in the office he is on lisinopril  40 and atenolol  50.  Was also supposed to be taking amlodipine  5 but somehow he got off of this.  Complaining of roaring sensation in his ears this been going on for 3 or 4 years now.  Ears also itch very deep and hurt.  No drainage is equal in both ears.  Reports she saw ear nose and throat which told him not in his ears had a CT scan of his head and a CTA both of which were negative.  Saw Dr. Lane, in [place], was prescribed venlafaxine  he thinks.  Venlafaxine  has been very helpful.  Prior to taking this he was irritable with lots of things.  Notices irritability got better while he was on venlafaxine .  Got a refill when he was in the ED.  Dr. Lane may also have been prescribed Ativan  as he was given something that was supposed to help his ears relax.  .  Is not sure but thinks that the Ativan  may have helped with the roaring in his ears.  Discussed going back to ENT to rule out a diagnosis of Mnire's disease.   ROS    Objective:     BP (!) 169/105 (BP Location: Left Arm, Patient Position: Sitting, Cuff Size: Normal)   Pulse (!) 49   Resp 18   Ht 5' 8 (1.727 m)   Wt 207 lb (93.9 kg)   SpO2 95%   BMI 31.47 kg/m    Physical Exam Vitals and nursing note reviewed.  Constitutional:      Appearance: Normal  appearance.  HENT:     Head: Normocephalic and atraumatic.     Right Ear: Tympanic membrane normal.     Left Ear: Tympanic membrane normal.  Eyes:     Conjunctiva/sclera: Conjunctivae normal.  Cardiovascular:     Rate and Rhythm: Normal rate and regular rhythm.  Pulmonary:     Effort: Pulmonary effort is normal.     Breath sounds: Normal breath sounds.  Musculoskeletal:     Right lower leg: No edema.     Left lower leg: No edema.  Skin:    General: Skin is warm and dry.  Neurological:     Mental Status: He is alert and oriented to person, place, and time.  Psychiatric:        Mood and Affect: Mood normal.        Behavior: Behavior normal.        Thought Content: Thought content normal.        Judgment: Judgment normal.          No results found for any visits on 09/30/23.    The ASCVD Risk score (Arnett DK, et al., 2019) failed to calculate for the following reasons:   Cannot find  a previous HDL lab   Cannot find a previous total cholesterol lab    Assessment & Plan:  Dizziness of unknown cause Assessment & Plan: Reports concurrent roaring in his ears and ear pain bilaterally.  Reports he seen an ENT and was told not in my ears.  Then he saw Dr. Lane in [place], who prescribed him venlafaxine .  Has noticed he is a little bit better on venlafaxine .  Was getting very irritated with all the symptoms and that is not as bad on venlafaxine .  Went to the emergency room and got a prescription refill of his venlafaxine  and needs this refilled today.   Secondary hypertension  Primary hypertension -     cloNIDine  HCl -     cloNIDine  HCl  Pneumonia of right lower lobe due to infectious organism Assessment & Plan: Had a chest x-ray in the ED that showed right lower lobe infiltrate.  He had a slight cough but no fever or chills.  He was achy all over and he could not breathe very well.  He would bend over to tie his shoes and could not breathe at all.  He is on doxycycline  given to him in the ED.  Advised he needs a follow-up chest x-ray in a month to make sure this clears.  Orders: -     DG Chest 2 View; Future  Hypertension, uncontrolled Assessment & Plan: Is on atenolol  50 mg, lisinopril  40 mg and supposed to be on amlodipine  5 mg but he is off this right now.  Recheck blood pressure 199/120.  Gave him clonidine  0.1 mg and ask him to relax.  Reports she is a little aggravated because he got lost on the way to the office today. Gave him clonidine  0.1 mg in the office.  Repeat blood pressure improved. 179/107.  Second clonidine  0.1 mg and recheck BP 169/105. Restart amlodipine  10 mg tomorrow morning.  Follow-up blood pressure next week.  Will consider thiazide diuretic if he does not have control.   Depression, acute Assessment & Plan: Reports he has been on venlafaxine  for several months.  Restarted about 2 weeks ago so it has not gotten its full effect.  Denies any suicidal or homicidal ideation.  Is not interested in seeing a psychiatrist or therapist.  Agrees to follow-up in a month for mood assessment.   Moderately severe major depression (HCC)  Other orders -     amLODIPine  Besylate; Take 1 tablet (10 mg total) by mouth daily.  Dispense: 90 tablet; Refill: 1 -     Venlafaxine  HCl ER; Take 1 capsule (75 mg total) by mouth daily with breakfast.  Dispense: 30 capsule; Refill: 3     Return in about 1 week (around 10/07/2023), or BP check, for BP recheck.    Mark Benecke K Alera Quevedo, MD

## 2023-09-30 NOTE — Assessment & Plan Note (Signed)
 Reports he has been on venlafaxine  for several months.  Restarted about 2 weeks ago so it has not gotten its full effect.  Denies any suicidal or homicidal ideation.  Is not interested in seeing a psychiatrist or therapist.  Agrees to follow-up in a month for mood assessment.

## 2023-09-30 NOTE — Assessment & Plan Note (Addendum)
 Had a chest x-ray in the ED that showed right lower lobe infiltrate.  He had a slight cough but no fever or chills.  He was achy all over and he could not breathe very well.  He would bend over to tie his shoes and could not breathe at all.  He is on doxycycline given to him in the ED.  Advised he needs a follow-up chest x-ray in a month to make sure this clears.

## 2023-09-30 NOTE — Assessment & Plan Note (Addendum)
 Reports concurrent roaring in his ears and ear pain bilaterally.  Reports he seen an ENT and was told not in my ears.  Then he saw Dr. Lane in [place], who prescribed him venlafaxine .  Has noticed he is a little bit better on venlafaxine .  Was getting very irritated with all the symptoms and that is not as bad on venlafaxine .  Went to the emergency room and got a prescription refill of his venlafaxine  and needs this refilled today.

## 2023-09-30 NOTE — Assessment & Plan Note (Addendum)
 Is on atenolol  50 mg, lisinopril  40 mg and supposed to be on amlodipine  5 mg but he is off this right now.  Recheck blood pressure 199/120.  Gave him clonidine  0.1 mg and ask him to relax.  Reports she is a little aggravated because he got lost on the way to the office today. Gave him clonidine  0.1 mg in the office.  Repeat blood pressure improved. 179/107.  Second clonidine  0.1 mg and recheck BP 169/105. Restart amlodipine  10 mg tomorrow morning.  Follow-up blood pressure next week.  Will consider thiazide diuretic if he does not have control.

## 2023-10-07 ENCOUNTER — Emergency Department: Payer: BLUE CROSS/BLUE SHIELD

## 2023-10-07 ENCOUNTER — Ambulatory Visit: Payer: BLUE CROSS/BLUE SHIELD | Admitting: Family Medicine

## 2023-10-07 DIAGNOSIS — Z79899 Other long term (current) drug therapy: Secondary | ICD-10-CM | POA: Insufficient documentation

## 2023-10-07 DIAGNOSIS — Z20822 Contact with and (suspected) exposure to covid-19: Secondary | ICD-10-CM | POA: Diagnosis not present

## 2023-10-07 DIAGNOSIS — I10 Essential (primary) hypertension: Secondary | ICD-10-CM | POA: Insufficient documentation

## 2023-10-07 DIAGNOSIS — R0789 Other chest pain: Secondary | ICD-10-CM | POA: Diagnosis present

## 2023-10-07 LAB — CBC
HCT: 41.5 % (ref 39.0–52.0)
Hemoglobin: 15.1 g/dL (ref 13.0–17.0)
MCH: 34.2 pg — ABNORMAL HIGH (ref 26.0–34.0)
MCHC: 36.4 g/dL — ABNORMAL HIGH (ref 30.0–36.0)
MCV: 94.1 fL (ref 80.0–100.0)
Platelets: 250 10*3/uL (ref 150–400)
RBC: 4.41 MIL/uL (ref 4.22–5.81)
RDW: 11.9 % (ref 11.5–15.5)
WBC: 7.2 10*3/uL (ref 4.0–10.5)
nRBC: 0 % (ref 0.0–0.2)

## 2023-10-07 LAB — BASIC METABOLIC PANEL
Anion gap: 13 (ref 5–15)
BUN: 15 mg/dL (ref 8–23)
CO2: 20 mmol/L — ABNORMAL LOW (ref 22–32)
Calcium: 9.5 mg/dL (ref 8.9–10.3)
Chloride: 100 mmol/L (ref 98–111)
Creatinine, Ser: 1.03 mg/dL (ref 0.61–1.24)
GFR, Estimated: >60 mL/min (ref >60)
Glucose, Bld: 110 mg/dL — ABNORMAL HIGH (ref 70–99)
Potassium: 3.4 mmol/L — ABNORMAL LOW (ref 3.5–5.1)
Sodium: 133 mmol/L — ABNORMAL LOW (ref 135–145)

## 2023-10-07 LAB — TROPONIN I (HIGH SENSITIVITY): Troponin I (High Sensitivity): 11 ng/L (ref <18)

## 2023-10-07 NOTE — ED Notes (Signed)
 First nurse note-pt brought in via ems from work.  Pt has chest pain since this am.  Pt has 324 asa and 2 ntg.  Bp 165/111,pulse 70, oxygen sats 98%  pt alert in wheelchair in lobby.

## 2023-10-07 NOTE — ED Triage Notes (Signed)
 Chest tightness today. Onset this morning, worsening throughout the day. Recent PNA, last antibiotic yesterday  AAOx3.  Skin warm and dry. NAD  Currently denies CP, only c/p headache.

## 2023-10-08 ENCOUNTER — Emergency Department: Payer: BLUE CROSS/BLUE SHIELD

## 2023-10-08 ENCOUNTER — Emergency Department
Admission: EM | Admit: 2023-10-08 | Discharge: 2023-10-08 | Disposition: A | Discharge status: Home / Self Care | Payer: BLUE CROSS/BLUE SHIELD | Attending: Emergency Medicine | Admitting: Emergency Medicine

## 2023-10-08 DIAGNOSIS — I10 Essential (primary) hypertension: Secondary | ICD-10-CM

## 2023-10-08 DIAGNOSIS — R42 Dizziness and giddiness: Secondary | ICD-10-CM

## 2023-10-08 DIAGNOSIS — R079 Chest pain, unspecified: Secondary | ICD-10-CM

## 2023-10-08 LAB — RESP PANEL BY RT-PCR (RSV, FLU A&B, COVID)  RVPGX2
Influenza A by PCR: NEGATIVE
Influenza B by PCR: NEGATIVE
Resp Syncytial Virus by PCR: NEGATIVE
SARS Coronavirus 2 by RT PCR: NEGATIVE

## 2023-10-08 LAB — TROPONIN I (HIGH SENSITIVITY): Troponin I (High Sensitivity): 10 ng/L (ref <18)

## 2023-10-08 MED ORDER — HYDROCHLOROTHIAZIDE 25 MG PO TABS
25.0000 mg | ORAL_TABLET | Freq: Once | ORAL | Status: AC
  Administered 2023-10-08: 25 mg via ORAL
  Filled 2023-10-08: qty 1

## 2023-10-08 MED ORDER — HYDROCHLOROTHIAZIDE 25 MG PO TABS: 25.0000 mg | ORAL_TABLET | Freq: Every day | ORAL | 1 refills | Status: AC

## 2023-10-08 MED ORDER — MECLIZINE HCL 25 MG PO TABS: 25.0000 mg | ORAL_TABLET | Freq: Three times a day (TID) | ORAL | 0 refills | Status: DC | PRN

## 2023-10-08 MED ORDER — PROCHLORPERAZINE EDISYLATE 10 MG/2ML IJ SOLN
10.0000 mg | Freq: Once | INTRAMUSCULAR | Status: AC
  Administered 2023-10-08: 10 mg via INTRAMUSCULAR
  Filled 2023-10-08: qty 2

## 2023-10-08 MED ORDER — MECLIZINE HCL 25 MG PO TABS
25.0000 mg | ORAL_TABLET | Freq: Once | ORAL | Status: AC
  Administered 2023-10-08: 25 mg via ORAL
  Filled 2023-10-08: qty 1

## 2023-10-08 NOTE — ED Provider Notes (Signed)
 [facility] Provider Note    Event Date/Time   First MD Initiated Contact with Patient 10/08/23 [phone]     (approximate)   History   Chest Pain   HPI  Angel Padilla is a 63 y.o. male with history of hypertension who presents to the emergency department with complaints of diffuse chest tightness, headache, vertigo, tinnitus, bilateral ear pain.  No hearing loss.  Chest pain has improved but still having headache, vertigo.  No numbness, tingling or focal weakness.  No head injury.  Not on blood thinners.  He denies associated shortness of breath, nausea or vomiting, diaphoresis, lightheadedness.   On atenolol  50mg  daily, lisinopril  40mg  daily, amlodpine 10mg  daily.  Was just seen by his PCP and had his amlodipine  increased from 5 mg to 10 mg.  They felt that if his blood pressure did not improve that they would start a thiazide diuretic.   No pain that radiates into his back or abdomen.  No ripping or tearing sensation.  History provided by patient.    Past Medical History:  Diagnosis Date   Hypertension     No past surgical history on file.  MEDICATIONS:  Prior to Admission medications   Medication Sig Start Date End Date Taking? Authorizing Provider  albuterol (VENTOLIN HFA) 108 (90 Base) MCG/ACT inhaler Inhale 2 puffs into the lungs every 6 (six) hours as needed for shortness of breath. Patient not taking: Reported on 09/30/2023 09/24/23 09/23/24  [provider]  amLODipine  (NORVASC ) 10 MG tablet Take 1 tablet (10 mg total) by mouth daily. 09/30/23   Ziglar, Susan K, MD  atenolol  (TENORMIN ) 50 MG tablet Take 50 mg by mouth.    [provider]  atenolol  (TENORMIN ) 50 MG tablet Take by mouth. 08/18/21   [provider]  clonazePAM  (KLONOPIN ) 0.25 MG disintegrating tablet Take 1 tablet (0.25 mg total) by mouth 2 (two) times daily. Patient not taking: Reported on 09/30/2023 07/28/23   Dorothyann Drivers, MD  clonazePAM  (KLONOPIN )  0.5 MG tablet Take by mouth. 07/05/22   [provider]  cyclobenzaprine  (FLEXERIL ) 5 MG tablet Take 1 tablet (5 mg total) by mouth 3 (three) times daily as needed for muscle spasms. 06/09/23   Cleaster Tinnie LABOR, PA-C  gabapentin  (NEURONTIN ) 300 MG capsule Take 1 capsule (300 mg total) by mouth 3 (three) times daily. 07/28/23 07/27/24  Dorothyann Drivers, MD  gabapentin  (NEURONTIN ) 300 MG capsule TAKE 1 CAPSULE BY MOUTH 3 TIMES A DAY AND CONTINUE THAT DOSE 09/05/22   [provider]  ibuprofen (ADVIL) 400 MG tablet Take by mouth.    [provider]  ibuprofen (ADVIL,MOTRIN) 600 MG tablet Take 600 mg by mouth every 6 (six) hours as needed.    [provider]  lisinopril  (PRINIVIL ,ZESTRIL ) 20 MG tablet Take 20 mg by mouth daily.    [provider]  lisinopril  (ZESTRIL ) 40 MG tablet Take by mouth. 08/18/21   [provider]  lovastatin (MEVACOR) 40 MG tablet Take by mouth.    [provider]  meclizine  (ANTIVERT ) 12.5 MG tablet Take 12.5mg  1-3 times daily as needed for dizziness. 03/15/22   [provider]  meclizine  (ANTIVERT ) 25 MG tablet Take 1 tablet (25 mg total) by mouth 3 (three) times daily as needed for dizziness. 12/05/19   Paduchowski, Kevin, MD  nortriptyline (PAMELOR) 50 MG capsule Take by mouth. 03/15/22   [provider]  ondansetron  (ZOFRAN  ODT) 4 MG disintegrating tablet Take 1 tablet (4 mg  total) by mouth every 8 (eight) hours as needed for nausea or vomiting. Patient not taking: Reported on 09/30/2023 12/06/19   Willo Dunnings, MD  sildenafil (REVATIO) 20 MG tablet Take by mouth. 09/13/21   [provider]  venlafaxine  (EFFEXOR ) 75 MG tablet Take 75 mg by mouth daily.    [provider]  venlafaxine  XR (EFFEXOR  XR) 75 MG 24 hr capsule Take 1 capsule (75 mg total) by mouth daily with breakfast. 09/30/23   Ziglar, Susan K, MD  venlafaxine  XR (EFFEXOR -XR) 37.5 MG 24 hr capsule Take by mouth.  06/02/21   [provider]  venlafaxine  XR (EFFEXOR -XR) 75 MG 24 hr capsule Take by mouth. 06/03/23 09/23/24  [provider]  zolpidem (AMBIEN) 10 MG tablet Take 10 mg by mouth at bedtime. 06/20/23   [provider]    Physical Exam   Triage Vital Signs: ED Triage Vitals  Encounter Vitals Group     BP 10/07/23 1717 (!) 165/111     Systolic BP Percentile --      Diastolic BP Percentile --      Pulse Rate 10/07/23 1717 (!) 50     Resp 10/07/23 1717 16     Temp 10/07/23 1717 98.4 F (36.9 C)     Temp Source 10/07/23 1717 Oral     SpO2 10/07/23 1717 94 %     Weight --      Height --      Head Circumference --      Peak Flow --      Pain Score 10/07/23 1716 8     Pain Loc --      Pain Education --      Exclude from Growth Chart --     Most recent vital signs: Vitals:   10/08/23 0504 10/08/23 0609  BP: (!) 163/95 117/68  Pulse: (!) 48 (!) 50  Resp:  17  Temp:  98 F (36.7 C)  SpO2: 100% 100%    CONSTITUTIONAL: Alert, responds appropriately to questions. Well-appearing; well-nourished HEAD: Normocephalic, atraumatic EYES: Conjunctivae clear, pupils appear equal, sclera nonicteric ENT: normal nose; moist mucous membranes; TMs are clear bilaterally without erythema, purulence, bulging, perforation, effusion.  No cerumen impaction or sign of foreign body in the external auditory canal. No inflammation, erythema or drainage from the external auditory canal. No signs of mastoiditis. No pain with manipulation of the pinna bilaterally. NECK: Supple, normal ROM CARD: RRR; S1 and S2 appreciated RESP: Normal chest excursion without splinting or tachypnea; breath sounds clear and equal bilaterally; no wheezes, no rhonchi, no rales, no hypoxia or respiratory distress, speaking full sentences ABD/GI: Non-distended; soft, non-tender, no rebound, no guarding, no peritoneal signs BACK: The back appears normal EXT: Normal ROM in all joints; no deformity noted, no  edema SKIN: Normal color for age and race; warm; no rash on exposed skin NEURO: Moves all extremities equally, normal speech, normal sensation, no facial asymmetry, normal gait PSYCH: The patient's mood and manner are appropriate.   ED Results / Procedures / Treatments   LABS: (all labs ordered are listed, but only abnormal results are displayed) Labs Reviewed  BASIC METABOLIC PANEL - Abnormal; Notable for the following components:      Result Value   Sodium 133 (*)    Potassium 3.4 (*)    CO2 20 (*)    Glucose, Bld 110 (*)    All other components within normal limits  CBC - Abnormal; Notable for the following components:   [facility]  34.2 (*)    MCHC 36.4 (*)    All other components within normal limits  RESP PANEL BY RT-PCR (RSV, FLU A&B, COVID)  RVPGX2  TROPONIN I (HIGH SENSITIVITY)  TROPONIN I (HIGH SENSITIVITY)     EKG:  EKG Interpretation Date/Time:  Monday October 07 2023 17:18:40 EST Ventricular Rate:  51 PR Interval:  166 QRS Duration:  90 QT Interval:  462 QTC Calculation: 425 R Axis:   9  Text Interpretation: Sinus bradycardia Otherwise normal ECG When compared with ECG of 28-Jul-2023 10:36, Sinus rhythm has replaced Junctional rhythm Confirmed by Neomi Neptune [phone]) on 10/08/2023 4:13:41 AM         RADIOLOGY: My personal review and interpretation of imaging: Chest x-ray clear.  I have personally reviewed all radiology reports.   CT HEAD WO CONTRAST ( ) Result Date: 10/08/2023 CLINICAL DATA:  Headache, new onset. EXAM: CT HEAD WITHOUT CONTRAST TECHNIQUE: Contiguous axial images were obtained from the base of the skull through the vertex without intravenous contrast. RADIATION DOSE REDUCTION: This exam was performed according to the departmental dose-optimization program which includes automated exposure control, adjustment of the mA and/or kV according to patient size and/or use of iterative reconstruction technique. COMPARISON:  Brain MRI 12/06/2019 FINDINGS:  Brain: No evidence of acute infarction, hemorrhage, hydrocephalus, extra-axial collection or mass lesion/mass effect. Vascular: No hyperdense vessel or unexpected calcification. Skull: Normal. Negative for fracture or focal lesion. Sinuses/Orbits: Negative for sinusitis. IMPRESSION: Unremarkable head CT.  No explanation for headache. Electronically Signed   By: Dorn Roulette M.D.   On: 10/08/2023 05:11   DG Chest 2 View Result Date: 10/07/2023 CLINICAL DATA:  Chest pain. EXAM: CHEST - 2 VIEW COMPARISON:  Chest radiograph dated May 05, 2020. FINDINGS: The heart size and mediastinal contours are within normal limits. No focal consolidation, pleural effusion, or pneumothorax. No acute osseous abnormality. IMPRESSION: No acute cardiopulmonary findings. Electronically Signed   By: Harrietta Sherry M.D.   On: 10/07/2023 18:57     PROCEDURES:  Critical Care performed: No     Procedures    IMPRESSION / MDM / ASSESSMENT AND PLAN / ED COURSE  I reviewed the triage vital signs and the nursing notes.    Patient here with complaints of chest pain, hypertension, headache, vertigo.  The patient is on the cardiac monitor to evaluate for evidence of arrhythmia and/or significant heart rate changes.   DIFFERENTIAL DIAGNOSIS (includes but not limited to):   ACS, PE, dissection, pneumonia, pneumothorax, anemia, electrolyte derangement, peripheral vertigo, Mnire's disease, intracranial hemorrhage, doubt stroke   Patient's presentation is most consistent with acute presentation with potential threat to life or bodily function.   PLAN: Workup initiated from triage.  Normal hemoglobin, normal electrolytes.  Troponin x 2 negative.  COVID, flu and RSV negative.  Chest x-ray reviewed and interpreted by myself and the radiologist and is clear.  Reports his chest pain has resolved.  His EKG is nonischemic.  He does have risk factors for ACS however symptoms seem somewhat atypical.  Will place cardiology  referral.  As for his vertigo he is also having tinnitus and ear pain.  Discussed with him that this could be Mnire's disease.  This seems to have been going on for a long period of time and he has no focal neurologic deficits currently.  Low suspicion for stroke.  Will give meclizine .  He is complaining of headache which could be related to his blood pressure.  Will obtain CT head.  Will give Compazine   for headache.  Patient is hypertensive here.  Will start him on HCTZ per his PCPs recommendations.   MEDICATIONS GIVEN IN ED: Medications  prochlorperazine  (COMPAZINE ) injection 10 mg (10 mg Intramuscular Given 10/08/23 0459)  meclizine  (ANTIVERT ) tablet 25 mg (25 mg Oral Given 10/08/23 0459)  hydrochlorothiazide  (HYDRODIURIL ) tablet 25 mg (25 mg Oral Given 10/08/23 0459)     ED COURSE: Patient reports headache has improved.  Blood pressure now down to 117/68.  CT head reviewed and interpreted by myself and the radiologist and is unremarkable.  I feel he is safe for discharge home with close outpatient follow-up with his PCP and cardiologist.  Will also give ENT follow-up given his symptoms of vertigo, tenderness, ear pain.  No signs of otitis media, mastoiditis, cerumen impaction on exam.  Will discharge with meclizine  for symptomatic relief.  Patient comfortable with this plan.   At this time, I do not feel there is any life-threatening condition present. I reviewed all nursing notes, vitals, pertinent previous records.  All lab and urine results, EKGs, imaging ordered have been independently reviewed and interpreted by myself.  I reviewed all available radiology reports from any imaging ordered this visit.  Based on my assessment, I feel the patient is safe to be discharged home without further emergent workup and can continue workup as an outpatient as needed. Discussed all findings, treatment plan as well as usual and customary return precautions.  They verbalize understanding and are  comfortable with this plan.  Outpatient follow-up has been provided as needed.  All questions have been answered.    CONSULTS:  none   OUTSIDE RECORDS REVIEWED: Reviewed recent PCP note.       FINAL CLINICAL IMPRESSION(S) / ED DIAGNOSES   Final diagnoses:  Primary hypertension  Vertigo  Nonspecific chest pain  Hypertension, uncontrolled     Rx / DC Orders   ED Discharge Orders          Ordered    hydrochlorothiazide  (HYDRODIURIL ) 25 MG tablet  Daily        10/08/23 0537    meclizine  (ANTIVERT ) 25 MG tablet  3 times daily PRN        10/08/23 0537    Ambulatory referral to Cardiology       Comments: If you have not heard from the Cardiology office within the next 72 hours please call [phone].   10/08/23 0537             Note:  This document was prepared using Dragon voice recognition software and may include unintentional dictation errors.   Erman Thum, Josette SAILOR, DO 10/08/23 [phone]

## 2023-10-22 ENCOUNTER — Telehealth: Payer: Self-pay | Admitting: Family Medicine

## 2023-10-22 MED ORDER — LISINOPRIL 40 MG PO TABS: 40.0000 mg | ORAL_TABLET | Freq: Every day | ORAL | 3 refills | Status: DC

## 2023-10-22 MED ORDER — VENLAFAXINE HCL ER 75 MG PO CP24: 75.0000 mg | ORAL_CAPSULE | Freq: Every day | ORAL | 3 refills | Status: DC

## 2023-10-22 NOTE — Telephone Encounter (Signed)
Refilled his lisinopril 40mg  and venlafaxine 75mg  daily.  He presented to office and asked for refills of his medication

## 2023-11-11 ENCOUNTER — Other Ambulatory Visit: Payer: Self-pay | Admitting: Family Medicine

## 2023-11-11 DIAGNOSIS — I10 Essential (primary) hypertension: Secondary | ICD-10-CM

## 2023-11-11 MED ORDER — ATENOLOL 50 MG PO TABS: 50.0000 mg | ORAL_TABLET | Freq: Every day | ORAL | 3 refills | Status: AC

## 2023-11-11 NOTE — Telephone Encounter (Signed)
Copied from CRM [id number]. Topic: Clinical - Medication Refill >> Nov 11, 2023 10:35 AM Antony Haste wrote: Most Recent Primary Care Visit:   Medication: atenolol (TENORMIN) 50 MG tablet  Has the patient contacted their pharmacy? Yes (Agent: If no, request that the patient contact the pharmacy for the refill. If patient does not wish to contact the pharmacy document the reason why and proceed with request.) (Agent: If yes, when and what did the pharmacy advise?)  Is this the correct pharmacy for this prescription? Yes If no, delete pharmacy and type the correct one.  This is the patient's preferred pharmacy:    [facility] [address] ([address]),  - [address] [place]) [place] [address] Phone: [phone] Fax: [phone]   Has the prescription been filled recently? No  Is the patient out of the medication? Yes  Has the patient been seen for an appointment in the last year OR does the patient have an upcoming appointment? Yes  Can we respond through MyChart? No, callback preferred.  Agent: Please be advised that Rx refills may take up to 3 business days. We ask that you follow-up with your pharmacy.

## 2023-11-11 NOTE — Telephone Encounter (Signed)
Copied from CRM [id number]. Topic: Clinical - Medication Refill >> Nov 11, 2023  4:13 PM Gery Pray wrote: Most Recent Primary Care Visit:   Medication: atenolol (TENORMIN) 50 MG tablet   Has the patient contacted their pharmacy? Yes (Agent: If no, request that the patient contact the pharmacy for the refill. If patient does not wish to contact the pharmacy document the reason why and proceed with request.) (Agent: If yes, when and what did the pharmacy advise?)  Is this the correct pharmacy for this prescription? Yes If no, delete pharmacy and type the correct one.  This is the patient's preferred pharmacy:  [facility] [address], [place] - [address] [place] [address] Phone: [phone] Fax: [phone]  Has the prescription been filled recently? No  Is the patient out of the medication? Yes  Has the patient been seen for an appointment in the last year OR does the patient have an upcoming appointment? Yes  Can we respond through MyChart? No  Agent: Please be advised that Rx refills may take up to 3 business days. We ask that you follow-up with your pharmacy.

## 2023-11-11 NOTE — Telephone Encounter (Signed)
Please send to [facility] [address]

## 2024-01-08 ENCOUNTER — Encounter: Payer: Self-pay | Admitting: Family Medicine

## 2024-01-08 ENCOUNTER — Ambulatory Visit (INDEPENDENT_AMBULATORY_CARE_PROVIDER_SITE_OTHER): Payer: Self-pay | Admitting: Family Medicine

## 2024-01-08 VITALS — BP 152/99 | HR 48 | Temp 98.0°F | Resp 18 | Ht 68.0 in | Wt 205.0 lb

## 2024-01-08 DIAGNOSIS — F32A Depression, unspecified: Secondary | ICD-10-CM

## 2024-01-08 DIAGNOSIS — R42 Dizziness and giddiness: Secondary | ICD-10-CM | POA: Diagnosis not present

## 2024-01-08 DIAGNOSIS — F419 Anxiety disorder, unspecified: Secondary | ICD-10-CM | POA: Diagnosis not present

## 2024-01-08 DIAGNOSIS — I10 Essential (primary) hypertension: Secondary | ICD-10-CM | POA: Diagnosis not present

## 2024-01-08 MED ORDER — VENLAFAXINE HCL ER 150 MG PO CP24: 150.0000 mg | ORAL_CAPSULE | Freq: Every day | ORAL | 1 refills | Status: DC

## 2024-01-08 MED ORDER — MECLIZINE HCL 25 MG PO TABS
25.0000 mg | ORAL_TABLET | Freq: Three times a day (TID) | ORAL | 0 refills | Status: DC | PRN
Start: 2024-01-08 — End: 2024-01-30

## 2024-01-08 MED ORDER — ATENOLOL 50 MG PO TABS: 50.0000 mg | ORAL_TABLET | Freq: Every day | ORAL | 1 refills | Status: AC

## 2024-01-08 MED ORDER — AMLODIPINE BESYLATE 10 MG PO TABS: 10.0000 mg | ORAL_TABLET | Freq: Every day | ORAL | 1 refills | Status: DC

## 2024-01-08 MED ORDER — HYDROCHLOROTHIAZIDE 25 MG PO TABS: 25.0000 mg | ORAL_TABLET | Freq: Every day | ORAL | 3 refills | Status: AC

## 2024-01-08 NOTE — Progress Notes (Signed)
 Established Patient Office Visit  Subjective   Patient ID: Angel Padilla, male    DOB: [birth year]  Age: 63 y.o. MRN: [id number]  Chief Complaint  Patient presents with   Medical Management of Chronic Issues    HPI  Delightful 63 year old man with HTN, BPV, GERD, HLD, eustachian tube dysfunction, tinnitus. He went to the emergency room 10/08/2023 complaining of chest tightness, headache, dizziness, nausea, ringing in his ears.  His EKG showed sinus bradycardia otherwise normal..  Head CT was unremarkable.  He was given meclizine, Compazine.  Reports the meclizine did help some for his dizziness and would like this refilled.  Saw a neurologist sometime ago who told him the dizziness and ringing in his ears would eventually stop.  He has had the dizziness for 4 years and always feels off balance.  States his eyes do not focus as well as they used to.  The tinnitus is in both ears and he has it all the time.  He is complaining of stress and tension and not being able to relax.  For example it might take him 4-5 hours to go to sleep and then he sleeps 5 or 6 hours.  States that he worries about a lot and that he worries about dumb things.  He gets anxiety when he is in a crowd.  Has to sit on the end of the row so that he can exit the facility fastest. Reports his energy level is pretty good.  Not getting exercise he used to get.  He sits and watches monitors now.  He states that his motivation is pretty low.  He takes venlafaxine XR 75 mg daily.  PHQ-9 is 19 and GAD-7 is 13.  He denies bad thoughts or thoughts of self-harm or harm to others.  Takes atenolol 50 mg, lisinopril 40 mg and amlodipine 10 mg.  Blood pressure is not well-controlled.    ROS    Objective:     BP (!) 152/99 (BP Location: Left Arm, Patient Position: Sitting, Cuff Size: Normal)   Pulse (!) 48   Temp 98 F (36.7 C) (Oral)   Resp 18   Ht 5\' 8"  (1.727 m)   Wt 205 lb (93 kg)   SpO2 97%   BMI 31.17 kg/m    Physical  Exam Vitals and nursing note reviewed.  Constitutional:      Appearance: Normal appearance.  HENT:     Head: Normocephalic and atraumatic.  Eyes:     Conjunctiva/sclera: Conjunctivae normal.  Cardiovascular:     Rate and Rhythm: Normal rate and regular rhythm.  Pulmonary:     Effort: Pulmonary effort is normal.     Breath sounds: Normal breath sounds.  Musculoskeletal:     Right lower leg: No edema.     Left lower leg: No edema.  Skin:    General: Skin is warm and dry.  Neurological:     Mental Status: He is alert and oriented to person, place, and time.  Psychiatric:        Mood and Affect: Mood normal.        Behavior: Behavior normal.        Thought Content: Thought content normal.        Judgment: Judgment normal.          No results found for any visits on 01/08/24.    The ASCVD Risk score (Arnett DK, et al., 2019) failed to calculate for the following reasons:   Cannot find a  previous HDL lab   Cannot find a previous total cholesterol lab    Assessment & Plan:  Anxiety and depression Assessment & Plan: He is on venlafaxine XR 75 mg daily but is still stressed and anxious.  Having difficulty sleeping.  Increase to Effexor XR 150 mg daily and follow-up in a month.  Orders: -     Venlafaxine HCl ER; Take 1 capsule (150 mg total) by mouth daily with breakfast.  Dispense: 30 capsule; Refill: 1  Primary hypertension -     amLODIPine Besylate; Take 1 tablet (10 mg total) by mouth daily.  Dispense: 90 tablet; Refill: 1 -     Atenolol; Take 1 tablet (50 mg total) by mouth daily.  Dispense: 90 tablet; Refill: 1 -     hydroCHLOROthiazide; Take 1 tablet (25 mg total) by mouth daily.  Dispense: 90 tablet; Refill: 3  Dizziness -     Meclizine HCl; Take 1 tablet (25 mg total) by mouth 3 (three) times daily as needed for dizziness.  Dispense: 30 tablet; Refill: 0  Essential hypertension Assessment & Plan: He is taking atenolol 50 mg, lisinopril 40 mg and amlodipine 10  mg but his blood pressure is not well-controlled.  Blood pressures running in the 150s over 90s.  Adding HCTZ 25 mg a day.  Follow-up in a month for recheck of her blood pressure.      Return in about 4 weeks (around 02/05/2024) for BP recheck, mood disorder.    Lochlyn Zullo K Colandra Ohanian, MD

## 2024-01-08 NOTE — Assessment & Plan Note (Signed)
 He is on venlafaxine XR 75 mg daily but is still stressed and anxious.  Having difficulty sleeping.  Increase to Effexor XR 150 mg daily and follow-up in a month.

## 2024-01-08 NOTE — Assessment & Plan Note (Signed)
 He is taking atenolol 50 mg, lisinopril 40 mg and amlodipine 10 mg but his blood pressure is not well-controlled.  Blood pressures running in the 150s over 90s.  Adding HCTZ 25 mg a day.  Follow-up in a month for recheck of her blood pressure.

## 2024-01-09 ENCOUNTER — Other Ambulatory Visit: Payer: Self-pay | Admitting: Family Medicine

## 2024-01-09 DIAGNOSIS — R42 Dizziness and giddiness: Secondary | ICD-10-CM

## 2024-01-27 ENCOUNTER — Other Ambulatory Visit: Payer: Self-pay | Admitting: Family Medicine

## 2024-01-27 DIAGNOSIS — R42 Dizziness and giddiness: Secondary | ICD-10-CM

## 2024-01-30 ENCOUNTER — Other Ambulatory Visit: Payer: Self-pay | Admitting: Family Medicine

## 2024-01-30 DIAGNOSIS — R42 Dizziness and giddiness: Secondary | ICD-10-CM

## 2024-01-30 MED ORDER — MECLIZINE HCL 25 MG PO TABS: 25.0000 mg | ORAL_TABLET | Freq: Three times a day (TID) | ORAL | 0 refills | Status: DC | PRN

## 2024-01-30 NOTE — Telephone Encounter (Signed)
 Copied from CRM [id number]. Topic: Clinical - Medication Refill >> Jan 30, 2024  2:41 PM Sasha H wrote: Medication: meclizine  (ANTIVERT ) 25 MG tablet  Has the patient contacted their pharmacy? Yes (Agent: If no, request that the patient contact the pharmacy for the refill. If patient does not wish to contact the pharmacy document the reason why and proceed with request.) (Agent: If yes, when and what did the pharmacy advise?)  This is the patient's preferred pharmacy:  [facility] [address] ([address]), [place] - [address] [place]) [place] [address] Phone: [phone] Fax: [phone]  Is this the correct pharmacy for this prescription? Yes If no, delete pharmacy and type the correct one.   Has the prescription been filled recently? Yes  Is the patient out of the medication? Yes  Has the patient been seen for an appointment in the last year OR does the patient have an upcoming appointment? Yes  Can we respond through MyChart? Yes  Agent: Please be advised that Rx refills may take up to 3 business days. We ask that you follow-up with your pharmacy.

## 2024-02-07 ENCOUNTER — Ambulatory Visit: Admitting: Family Medicine

## 2024-02-18 ENCOUNTER — Other Ambulatory Visit: Payer: Self-pay | Admitting: Family Medicine

## 2024-02-18 DIAGNOSIS — R42 Dizziness and giddiness: Secondary | ICD-10-CM

## 2024-03-06 ENCOUNTER — Other Ambulatory Visit: Payer: Self-pay | Admitting: Family Medicine

## 2024-03-06 DIAGNOSIS — F32A Depression, unspecified: Secondary | ICD-10-CM

## 2024-03-12 NOTE — Telephone Encounter (Signed)
 Please advise on refill request

## 2024-03-18 ENCOUNTER — Other Ambulatory Visit: Payer: Self-pay | Admitting: Family Medicine

## 2024-03-18 DIAGNOSIS — R42 Dizziness and giddiness: Secondary | ICD-10-CM

## 2024-07-16 ENCOUNTER — Other Ambulatory Visit: Payer: Self-pay | Admitting: Family Medicine

## 2024-07-16 DIAGNOSIS — F419 Anxiety disorder, unspecified: Secondary | ICD-10-CM

## 2024-10-06 ENCOUNTER — Ambulatory Visit: Payer: Self-pay | Admitting: Family Medicine

## 2024-10-06 VITALS — BP 146/95 | HR 52 | Temp 97.8°F | Resp 16 | Wt 213.2 lb

## 2024-10-06 DIAGNOSIS — F419 Anxiety disorder, unspecified: Secondary | ICD-10-CM

## 2024-10-06 DIAGNOSIS — F5101 Primary insomnia: Secondary | ICD-10-CM | POA: Insufficient documentation

## 2024-10-06 DIAGNOSIS — I10 Essential (primary) hypertension: Secondary | ICD-10-CM

## 2024-10-06 DIAGNOSIS — F32A Depression, unspecified: Secondary | ICD-10-CM

## 2024-10-06 MED ORDER — ZOLPIDEM TARTRATE 10 MG PO TABS: 10.0000 mg | ORAL_TABLET | Freq: Every evening | ORAL | 1 refills | Status: AC | PRN

## 2024-10-06 MED ORDER — AMLODIPINE BESYLATE 10 MG PO TABS: 10.0000 mg | ORAL_TABLET | Freq: Every day | ORAL | 1 refills | Status: AC

## 2024-10-06 NOTE — Assessment & Plan Note (Signed)
 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  Has done well with Ambien  in the past.  Ambien  10 mg dispense #15 with 1 refill.

## 2024-10-06 NOTE — Assessment & Plan Note (Signed)
 He takes amlodipine  10 mg, atenolol  50 mg, lisinopril  40 mg and HCTZ 25 mg daily.  He has been out of amlodipine  for some time now blood pressure is 146/95.  Ask him to be taking all 4 of his blood pressure medicines and come in for a blood pressure check.  He does not like to check his blood pressure at home.

## 2024-10-06 NOTE — Assessment & Plan Note (Addendum)
 His PHQ-9 score is elevated as is his GAD-7.  He reports he has no thoughts of self-harm or harm to others.  He does not sleep very well so he is chronically tired and he thinks that might have a lot to do with his emotional state.  He is working third shift and security.  He is taking Effexor  XR 150 mg daily.

## 2024-10-06 NOTE — Progress Notes (Signed)
 "  Established Patient Office Visit  Subjective   Patient ID: Angel Padilla, male    DOB: [birth year]  Age: 64 y.o. MRN: [id number]  Chief Complaint  Patient presents with   b/p follow-up    Pt. Here for a blood pressure follow-up. Pt. Denies pain at this time.     HPI Discussed the use of AI scribe software for clinical note transcription with the patient, who gave verbal consent to proceed.  History of Present Illness   Angel Padilla is a 64 year old male with hypertension who presents for medication refill and sleep issues.  He has been without amlodipine  for an extended period. He takes amlodipine  10mg  hydrochlorothiazide  25mg , atenolol  50mg , lisinopril  40mg .  He denies any problems taking his blood pressure mediation.  He does not check his BP at home.    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 He feels very tired, especially on his days off, and has trouble sleeping well. He has previously used Ambien  for sleep, which he finds effective, but running out of it is problematic. Over-the-counter sleep aids like Excedrin PM and Tylenol PM cause restless leg syndrome.  Socially, he lives alone in a travel trailer which he is currently remodeling. His son, who had been living with him for a year, recently moved out. 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  No emotional distress, reports feeling fine emotionally. He denies any SI or HI.  He is taking his Effexor  XR 150mg  daily.       Angel Padilla is a 64 year old male with hypertension who presents for medication refill and sleep issues.  He has been without amlodipine  for an extended period. He has a history of hypertension and has been on amlodipine  for management.  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 He  feels very tired, especially on his days off, and has trouble sleeping well. He has previously used Ambien  for sleep, which he finds effective, but running out of it is problematic. Over-the-counter sleep aids like Excedrin PM and Tylenol PM cause restless leg syndrome, a condition he shares with his mother.  Socially, he lives alone in a mobile home, which he is currently remodeling. His son, who had been living with him for a year, recently moved out. 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  No emotional distress, reports feeling fine emotionally.        Objective:     BP (!) 146/95 (Cuff Size: Normal)   Pulse (!) 52   Temp 97.8 F (36.6 C) (Oral)   Resp 16   Wt 213 lb 3.2 oz (96.7 kg)   SpO2 97%   BMI 32.42 kg/m    Physical Exam Vitals and nursing note reviewed.  Constitutional:      Appearance: Normal appearance.  HENT:     Head: Normocephalic and atraumatic.  Eyes:     Conjunctiva/sclera: Conjunctivae normal.  Cardiovascular:     Rate and Rhythm: Normal rate and regular rhythm.  Pulmonary:     Effort: Pulmonary effort is normal.     Breath sounds: Normal breath sounds.  Musculoskeletal:     Right lower leg: No edema.     Left lower  leg: No edema.  Skin:    General: Skin is warm and dry.  Neurological:     Mental Status: He is alert and oriented to person, place, and time.  Psychiatric:        Mood and Affect: Mood normal.        Behavior: Behavior normal.        Thought Content: Thought content normal.        Judgment: Judgment normal.          No results found for any visits on 10/06/24.    The ASCVD Risk score (Arnett DK, et al., 2019) failed to calculate for the following reasons:   Cannot find a previous HDL lab   Cannot find a previous total cholesterol lab   * - Cholesterol units were assumed    Assessment & Plan:  Primary insomnia Assessment &  Plan: Works third shift and has a hard time sleeping during the day.  Has done well with Ambien  in the past.  Ambien  10 mg dispense #15 with 1 refill.  Orders: -     Zolpidem  Tartrate; Take 1 tablet (10 mg total) by mouth at bedtime as needed for sleep.  Dispense: 15 tablet; Refill: 1  Primary hypertension -     amLODIPine  Besylate; Take 1 tablet (10 mg total) by mouth daily.  Dispense: 90 tablet; Refill: 1 -     [id number]  Essential hypertension Assessment & Plan: He takes amlodipine  10 mg, atenolol  50 mg, lisinopril  40 mg and HCTZ 25 mg daily.  He has been out of amlodipine  for some time now blood pressure is 146/95.  Ask him to be taking all 4 of his blood pressure medicines and come in for a blood pressure check.  He does not like to check his blood pressure at home.   Anxiety and depression Assessment & Plan: His PHQ-9 score is elevated as is his GAD-7.  He reports he has no thoughts of self-harm or harm to others.  He does not sleep very well so he is chronically tired and he thinks that might have a lot to do with his emotional state.  He is working third shift and security.  He is taking Effexor  XR 150 mg daily.   Hypertension, uncontrolled Assessment & Plan: Checking his electrolytes because he started hydrochlorothiazide  a few months ago.        Return in about 8 weeks (around 12/01/2024).    Emmabelle Fear K Alen Matheson, MD "

## 2024-10-06 NOTE — Assessment & Plan Note (Signed)
 Checking his electrolytes because he started hydrochlorothiazide  a few months ago.

## 2024-10-07 ENCOUNTER — Ambulatory Visit: Payer: Self-pay | Admitting: Family Medicine

## 2024-10-07 LAB — CMP14+EGFR
ALT: 35 IU/L (ref 0–44)
AST: 30 IU/L (ref 0–40)
Albumin: 4.7 g/dL (ref 3.9–4.9)
Alkaline Phosphatase: 90 IU/L (ref 47–123)
BUN/Creatinine Ratio: 16 (ref 10–24)
BUN: 29 mg/dL — ABNORMAL HIGH (ref 8–27)
Bilirubin Total: 0.5 mg/dL (ref 0.0–1.2)
CO2: 23 mmol/L (ref 20–29)
Calcium: 9.8 mg/dL (ref 8.6–10.2)
Chloride: 98 mmol/L (ref 96–106)
Creatinine, Ser: 1.79 mg/dL — ABNORMAL HIGH (ref 0.76–1.27)
Globulin, Total: 3 g/dL (ref 1.5–4.5)
Glucose: 106 mg/dL — ABNORMAL HIGH (ref 70–99)
Potassium: 4.6 mmol/L (ref 3.5–5.2)
Sodium: 137 mmol/L (ref 134–144)
Total Protein: 7.7 g/dL (ref 6.0–8.5)
eGFR: 42 mL/min/1.73 — ABNORMAL LOW

## 2024-10-17 ENCOUNTER — Other Ambulatory Visit: Payer: Self-pay | Admitting: Family Medicine

## 2025-01-08 ENCOUNTER — Ambulatory Visit: Payer: Self-pay | Admitting: Family Medicine
